# Patient Record
Sex: Female | Born: 1975 | Race: White | Hispanic: No | Smoking: Current every day smoker
Health system: Southern US, Community
[De-identification: ages and names within clinical notes are randomized; demographics above are authoritative.]

## PROBLEM LIST (undated history)

## (undated) DIAGNOSIS — R42 Dizziness and giddiness: Secondary | ICD-10-CM

## (undated) DIAGNOSIS — F32A Depression, unspecified: Secondary | ICD-10-CM

## (undated) DIAGNOSIS — F419 Anxiety disorder, unspecified: Secondary | ICD-10-CM

## (undated) DIAGNOSIS — F329 Major depressive disorder, single episode, unspecified: Secondary | ICD-10-CM

## (undated) HISTORY — DX: Depression, unspecified: F32.A

## (undated) HISTORY — DX: Anxiety disorder, unspecified: F41.9

## (undated) HISTORY — DX: Major depressive disorder, single episode, unspecified: F32.9

## (undated) HISTORY — PX: OTHER SURGICAL HISTORY: SHX169

## (undated) HISTORY — PX: SPINE SURGERY: SHX786

## (undated) HISTORY — DX: Dizziness and giddiness: R42

---

## 1999-08-05 ENCOUNTER — Other Ambulatory Visit: Admission: RE | Admit: 1999-08-05 | Discharge: 1999-08-05 | Payer: Self-pay | Admitting: Obstetrics and Gynecology

## 2000-03-08 ENCOUNTER — Inpatient Hospital Stay (HOSPITAL_COMMUNITY): Admission: AD | Admit: 2000-03-08 | Discharge: 2000-03-08 | Payer: Self-pay | Admitting: Obstetrics and Gynecology

## 2000-03-18 ENCOUNTER — Inpatient Hospital Stay (HOSPITAL_COMMUNITY): Admission: AD | Admit: 2000-03-18 | Discharge: 2000-03-20 | Payer: Self-pay | Admitting: Obstetrics & Gynecology

## 2000-09-13 ENCOUNTER — Other Ambulatory Visit: Admission: RE | Admit: 2000-09-13 | Discharge: 2000-09-13 | Payer: Self-pay | Admitting: Obstetrics and Gynecology

## 2001-09-11 ENCOUNTER — Other Ambulatory Visit: Admission: RE | Admit: 2001-09-11 | Discharge: 2001-09-11 | Payer: Self-pay | Admitting: Obstetrics and Gynecology

## 2004-11-30 ENCOUNTER — Other Ambulatory Visit: Admission: RE | Admit: 2004-11-30 | Discharge: 2004-11-30 | Payer: Self-pay | Admitting: Family Medicine

## 2005-11-08 ENCOUNTER — Encounter: Admission: RE | Admit: 2005-11-08 | Discharge: 2005-11-08 | Payer: Self-pay | Admitting: Family Medicine

## 2007-05-18 ENCOUNTER — Other Ambulatory Visit: Admission: RE | Admit: 2007-05-18 | Discharge: 2007-05-18 | Payer: Self-pay | Admitting: Obstetrics and Gynecology

## 2008-12-03 ENCOUNTER — Inpatient Hospital Stay (HOSPITAL_COMMUNITY): Admission: AD | Admit: 2008-12-03 | Discharge: 2008-12-05 | Payer: Self-pay | Admitting: Obstetrics and Gynecology

## 2009-05-24 ENCOUNTER — Emergency Department: Payer: Self-pay | Admitting: Internal Medicine

## 2010-06-29 ENCOUNTER — Emergency Department (HOSPITAL_COMMUNITY)
Admission: EM | Admit: 2010-06-29 | Discharge: 2010-06-29 | Disposition: A | Discharge status: Home / Self Care | Payer: Self-pay | Attending: Emergency Medicine | Admitting: Emergency Medicine

## 2010-06-29 DIAGNOSIS — M5430 Sciatica, unspecified side: Secondary | ICD-10-CM | POA: Insufficient documentation

## 2010-06-29 DIAGNOSIS — M545 Low back pain, unspecified: Secondary | ICD-10-CM | POA: Insufficient documentation

## 2010-08-07 LAB — CBC
Hemoglobin: 12.3 g/dL (ref 12.0–15.0)
MCHC: 34.2 g/dL (ref 30.0–36.0)
MCV: 90.6 fL (ref 78.0–100.0)
Platelets: 381 10*3/uL (ref 150–400)
Platelets: 451 10*3/uL — ABNORMAL HIGH (ref 150–400)
WBC: 18.9 10*3/uL — ABNORMAL HIGH (ref 4.0–10.5)

## 2010-09-16 ENCOUNTER — Emergency Department (HOSPITAL_COMMUNITY)
Admission: EM | Admit: 2010-09-16 | Discharge: 2010-09-16 | Disposition: A | Discharge status: Home / Self Care | Payer: Self-pay | Attending: Emergency Medicine | Admitting: Emergency Medicine

## 2010-09-16 DIAGNOSIS — M549 Dorsalgia, unspecified: Secondary | ICD-10-CM | POA: Insufficient documentation

## 2010-09-16 DIAGNOSIS — R350 Frequency of micturition: Secondary | ICD-10-CM | POA: Insufficient documentation

## 2010-09-16 LAB — POCT PREGNANCY, URINE: Preg Test, Ur: NEGATIVE

## 2010-09-16 LAB — URINALYSIS, ROUTINE W REFLEX MICROSCOPIC
Bilirubin Urine: NEGATIVE
Glucose, UA: NEGATIVE mg/dL
Ketones, ur: NEGATIVE mg/dL
Protein, ur: NEGATIVE mg/dL
Specific Gravity, Urine: 1.008 (ref 1.005–1.030)

## 2010-09-21 ENCOUNTER — Ambulatory Visit (INDEPENDENT_AMBULATORY_CARE_PROVIDER_SITE_OTHER): Payer: Self-pay | Admitting: Family Medicine

## 2010-09-21 ENCOUNTER — Encounter: Payer: Self-pay | Admitting: Family Medicine

## 2010-09-21 VITALS — BP 112/70 | HR 62 | Wt 135.0 lb

## 2010-09-21 DIAGNOSIS — M461 Sacroiliitis, not elsewhere classified: Secondary | ICD-10-CM

## 2010-09-21 NOTE — Progress Notes (Signed)
  Subjective:    Patient ID: Alicia Moss, female    DOB: 1975-11-26, 35 y.o.   MRN: [id number]  HPI she is here for evaluation of back pain. She has been seen twice in the emergency room. Once in March and again several days ago. The emergency room record was reviewed. She was given steroids as well as a pain medication. She states that she is approximately 80% better but as the day progresses she gets more pain. She points to the right low back area. She's had no numbness, tingling or weakness. No history of injury to that area. She does not work and stays home to take care of her children.    Review of Systems     Objective:   Physical Exam alert and in no distress. Tender to palpation over the right SI joint. Pearlean Brownie and [place] test is positive. Negative straight leg raising. Normal reflexes and motor.       Assessment & Plan:  Sacroiliitis Recommend heat, stretching and anti-inflammatory. If continued difficulty, she will call for referral to physical therapy. Also discussed chiropractic for her.

## 2010-09-21 NOTE — Patient Instructions (Addendum)
Heat for 20 minutes 3 times a day. Aleve, 2 pills 3 times per day. Do some back stretching When this calms down make sure you do proper sitting standing and lifting If no relief call for referral to physical therapy or a chiropractor

## 2011-03-01 ENCOUNTER — Inpatient Hospital Stay (INDEPENDENT_AMBULATORY_CARE_PROVIDER_SITE_OTHER)
Admission: RE | Admit: 2011-03-01 | Discharge: 2011-03-01 | Disposition: A | Discharge status: Home / Self Care | Payer: Self-pay | Source: Ambulatory Visit | Attending: Family Medicine | Admitting: Family Medicine

## 2011-03-01 DIAGNOSIS — M5430 Sciatica, unspecified side: Secondary | ICD-10-CM

## 2011-03-04 ENCOUNTER — Other Ambulatory Visit (HOSPITAL_COMMUNITY): Payer: Self-pay | Admitting: Orthopaedic Surgery

## 2011-03-04 DIAGNOSIS — M545 Low back pain: Secondary | ICD-10-CM

## 2011-03-04 DIAGNOSIS — M79604 Pain in right leg: Secondary | ICD-10-CM

## 2011-03-11 ENCOUNTER — Ambulatory Visit (HOSPITAL_COMMUNITY)
Admission: RE | Admit: 2011-03-11 | Discharge: 2011-03-11 | Disposition: A | Discharge status: Home / Self Care | Payer: Self-pay | Source: Ambulatory Visit | Attending: Orthopaedic Surgery | Admitting: Orthopaedic Surgery

## 2011-03-11 DIAGNOSIS — M545 Low back pain, unspecified: Secondary | ICD-10-CM | POA: Insufficient documentation

## 2011-03-11 DIAGNOSIS — M79609 Pain in unspecified limb: Secondary | ICD-10-CM | POA: Insufficient documentation

## 2011-03-11 DIAGNOSIS — M5126 Other intervertebral disc displacement, lumbar region: Secondary | ICD-10-CM | POA: Insufficient documentation

## 2011-03-11 DIAGNOSIS — M4120 Other idiopathic scoliosis, site unspecified: Secondary | ICD-10-CM | POA: Insufficient documentation

## 2011-03-11 DIAGNOSIS — M79604 Pain in right leg: Secondary | ICD-10-CM

## 2013-07-29 ENCOUNTER — Ambulatory Visit: Payer: 59 | Admitting: Physical Therapy

## 2013-08-06 ENCOUNTER — Ambulatory Visit: Payer: 59 | Attending: Orthopaedic Surgery | Admitting: Physical Therapy

## 2013-08-06 DIAGNOSIS — M545 Low back pain, unspecified: Secondary | ICD-10-CM | POA: Insufficient documentation

## 2013-08-06 DIAGNOSIS — IMO0001 Reserved for inherently not codable concepts without codable children: Secondary | ICD-10-CM | POA: Insufficient documentation

## 2013-08-06 DIAGNOSIS — M25569 Pain in unspecified knee: Secondary | ICD-10-CM | POA: Insufficient documentation

## 2013-08-13 ENCOUNTER — Ambulatory Visit: Payer: 59 | Admitting: Physical Therapy

## 2013-08-16 ENCOUNTER — Ambulatory Visit: Payer: 59 | Admitting: Physical Therapy

## 2013-08-20 ENCOUNTER — Ambulatory Visit: Payer: 59 | Admitting: Physical Therapy

## 2013-08-27 ENCOUNTER — Ambulatory Visit: Payer: 59 | Admitting: Physical Therapy

## 2013-09-03 ENCOUNTER — Ambulatory Visit: Payer: 59 | Attending: Orthopaedic Surgery | Admitting: Physical Therapy

## 2013-09-03 DIAGNOSIS — M25569 Pain in unspecified knee: Secondary | ICD-10-CM | POA: Insufficient documentation

## 2013-09-03 DIAGNOSIS — M545 Low back pain, unspecified: Secondary | ICD-10-CM | POA: Insufficient documentation

## 2013-09-03 DIAGNOSIS — IMO0001 Reserved for inherently not codable concepts without codable children: Secondary | ICD-10-CM | POA: Insufficient documentation

## 2013-09-10 ENCOUNTER — Ambulatory Visit: Payer: 59 | Admitting: Physical Therapy

## 2014-10-13 ENCOUNTER — Encounter (HOSPITAL_COMMUNITY): Payer: Self-pay

## 2014-10-13 ENCOUNTER — Emergency Department (HOSPITAL_COMMUNITY): Payer: 59

## 2014-10-13 ENCOUNTER — Emergency Department (HOSPITAL_COMMUNITY)
Admission: EM | Admit: 2014-10-13 | Discharge: 2014-10-14 | Disposition: A | Discharge status: Home / Self Care | Payer: 59 | Attending: Emergency Medicine | Admitting: Emergency Medicine

## 2014-10-13 DIAGNOSIS — R109 Unspecified abdominal pain: Secondary | ICD-10-CM | POA: Diagnosis present

## 2014-10-13 DIAGNOSIS — Z720 Tobacco use: Secondary | ICD-10-CM | POA: Insufficient documentation

## 2014-10-13 DIAGNOSIS — R52 Pain, unspecified: Secondary | ICD-10-CM

## 2014-10-13 DIAGNOSIS — Z3202 Encounter for pregnancy test, result negative: Secondary | ICD-10-CM | POA: Insufficient documentation

## 2014-10-13 DIAGNOSIS — R1011 Right upper quadrant pain: Secondary | ICD-10-CM | POA: Diagnosis not present

## 2014-10-13 LAB — D-DIMER, QUANTITATIVE: D-Dimer, Quant: 0.81 ug/mL-FEU — ABNORMAL HIGH (ref 0.00–0.48)

## 2014-10-13 LAB — URINALYSIS, ROUTINE W REFLEX MICROSCOPIC
BILIRUBIN URINE: NEGATIVE
Glucose, UA: NEGATIVE mg/dL
Ketones, ur: NEGATIVE mg/dL
Leukocytes, UA: NEGATIVE
NITRITE: NEGATIVE
Protein, ur: NEGATIVE mg/dL
SPECIFIC GRAVITY, URINE: 1.016 (ref 1.005–1.030)
UROBILINOGEN UA: 1 mg/dL (ref 0.0–1.0)
pH: 7 (ref 5.0–8.0)

## 2014-10-13 LAB — CBC WITH DIFFERENTIAL/PLATELET
Basophils Absolute: 0 10*3/uL (ref 0.0–0.1)
Basophils Relative: 0 % (ref 0–1)
EOS PCT: 2 % (ref 0–5)
Eosinophils Absolute: 0.3 10*3/uL (ref 0.0–0.7)
HEMATOCRIT: 42.9 % (ref 36.0–46.0)
HEMOGLOBIN: 14.6 g/dL (ref 12.0–15.0)
LYMPHS PCT: 26 % (ref 12–46)
Lymphs Abs: 3 10*3/uL (ref 0.7–4.0)
MCH: 30.8 pg (ref 26.0–34.0)
MCHC: 34 g/dL (ref 30.0–36.0)
MCV: 90.5 fL (ref 78.0–100.0)
MONO ABS: 0.8 10*3/uL (ref 0.1–1.0)
MONOS PCT: 7 % (ref 3–12)
NEUTROS ABS: 7.4 10*3/uL (ref 1.7–7.7)
Neutrophils Relative %: 65 % (ref 43–77)
Platelets: 271 10*3/uL (ref 150–400)
RBC: 4.74 MIL/uL (ref 3.87–5.11)
RDW: 13 % (ref 11.5–15.5)
WBC: 11.4 10*3/uL — ABNORMAL HIGH (ref 4.0–10.5)

## 2014-10-13 LAB — COMPREHENSIVE METABOLIC PANEL
ALK PHOS: 49 U/L (ref 38–126)
ALT: 17 U/L (ref 14–54)
AST: 18 U/L (ref 15–41)
Albumin: 4.1 g/dL (ref 3.5–5.0)
Anion gap: 6 (ref 5–15)
BUN: 10 mg/dL (ref 6–20)
CO2: 23 mmol/L (ref 22–32)
Calcium: 8.9 mg/dL (ref 8.9–10.3)
Chloride: 107 mmol/L (ref 101–111)
Creatinine, Ser: 0.76 mg/dL (ref 0.44–1.00)
GFR calc Af Amer: >60 mL/min (ref >60)
GLUCOSE: 96 mg/dL (ref 65–99)
POTASSIUM: 3.4 mmol/L — AB (ref 3.5–5.1)
SODIUM: 136 mmol/L (ref 135–145)
TOTAL PROTEIN: 6.9 g/dL (ref 6.5–8.1)
Total Bilirubin: 0.4 mg/dL (ref 0.3–1.2)

## 2014-10-13 LAB — URINE MICROSCOPIC-ADD ON

## 2014-10-13 LAB — LIPASE, BLOOD: LIPASE: 18 U/L — AB (ref 22–51)

## 2014-10-13 LAB — PREGNANCY, URINE: Preg Test, Ur: NEGATIVE

## 2014-10-13 MED ORDER — HYDROCODONE-ACETAMINOPHEN 5-325 MG PO TABS
2.0000 | ORAL_TABLET | Freq: Once | ORAL | Status: AC
  Administered 2014-10-13: 2 via ORAL
  Filled 2014-10-13: qty 2

## 2014-10-13 MED ORDER — ONDANSETRON 4 MG PO TBDP
4.0000 mg | ORAL_TABLET | Freq: Once | ORAL | Status: AC
  Administered 2014-10-13: 4 mg via ORAL
  Filled 2014-10-13: qty 1

## 2014-10-13 MED ORDER — HYDROMORPHONE HCL 1 MG/ML IJ SOLN
1.0000 mg | Freq: Once | INTRAMUSCULAR | Status: DC
  Filled 2014-10-13: qty 1

## 2014-10-13 MED ORDER — ONDANSETRON HCL 4 MG/2ML IJ SOLN
4.0000 mg | Freq: Once | INTRAMUSCULAR | Status: DC
  Filled 2014-10-13: qty 2

## 2014-10-13 NOTE — ED Notes (Signed)
Pt complains of side pain under the right breast that acutely happened while she's riding down the road, she states it hurts when she takes a deep breath, no injury

## 2014-10-13 NOTE — ED Provider Notes (Signed)
CSN: [id number]     Arrival date & time 10/13/14  2001 History   First MD Initiated Contact with Patient 10/13/14 2100     Chief Complaint  Patient presents with  . Flank Pain     (Consider location/radiation/quality/duration/timing/severity/associated sxs/prior Treatment) Patient is a 39 y.o. female presenting with abdominal pain. The history is provided by the patient. No language interpreter was used.  Abdominal Pain Pain location:  RUQ Pain quality: sharp and stabbing   Pain radiates to:  Does not radiate Pain severity:  Severe Onset quality:  Sudden Duration:  6 hours Timing:  Constant Progression:  Worsening Chronicity:  New Relieved by:  Nothing Worsened by:  Nothing tried Ineffective treatments:  None tried Associated symptoms: no fever and no vomiting   Risk factors: has not had multiple surgeries and not pregnant     History reviewed. No pertinent past medical history. History reviewed. No pertinent past surgical history. History reviewed. No pertinent family history. History  Substance Use Topics  . Smoking status: Current Every Day Smoker -- 0.50 packs/day    Types: Cigarettes  . Smokeless tobacco: Never Used  . Alcohol Use: Yes   OB History    No data available     Review of Systems  Constitutional: Negative for fever.  Gastrointestinal: Positive for abdominal pain. Negative for vomiting.  All other systems reviewed and are negative.     Allergies  Review of patient's allergies indicates no known allergies.  Home Medications   Prior to Admission medications   Medication Sig Start Date End Date Taking? Authorizing Provider  ACETAMINOPHEN PO Take by mouth.   Yes Historical Provider, MD  Multiple Vitamins-Minerals (HAIR/SKIN/NAILS PO) Take by mouth.   Yes Historical Provider, MD   BP 116/29 mmHg  Pulse 100  Temp(Src) 98.6 F (37 C) (Oral)  Resp 20  SpO2 100% Physical Exam  Constitutional: She is oriented to person, place, and time. She  appears well-developed and well-nourished.  HENT:  Head: Normocephalic.  Right Ear: External ear normal.  Left Ear: External ear normal.  Eyes: Conjunctivae and EOM are normal. Pupils are equal, round, and reactive to light.  Neck: Normal range of motion.  Cardiovascular: Normal rate and normal heart sounds.   Pulmonary/Chest: Effort normal.  Abdominal: Soft. She exhibits no distension.  Musculoskeletal: Normal range of motion.  Neurological: She is alert and oriented to person, place, and time.  Skin: Skin is warm.  Psychiatric: She has a normal mood and affect.  Nursing note and vitals reviewed.   ED Course  Procedures (including critical care time) Labs Review Labs Reviewed  CBC WITH DIFFERENTIAL/PLATELET - Abnormal; Notable for the following:    WBC 11.4 (*)    All other components within normal limits  COMPREHENSIVE METABOLIC PANEL - Abnormal; Notable for the following:    Potassium 3.4 (*)    All other components within normal limits  LIPASE, BLOOD - Abnormal; Notable for the following:    Lipase 18 (*)    All other components within normal limits  URINALYSIS, ROUTINE W REFLEX MICROSCOPIC (NOT AT [facility]) - Abnormal; Notable for the following:    APPearance CLOUDY (*)    Hgb urine dipstick MODERATE (*)    All other components within normal limits  URINE MICROSCOPIC-ADD ON - Abnormal; Notable for the following:    Squamous Epithelial / LPF MANY (*)    All other components within normal limits  PREGNANCY, URINE    Imaging Review No results found.  EKG Interpretation   Date/Time:  Monday October 13 2014 20:18:17 EDT Ventricular Rate:  86 PR Interval:  143 QRS Duration: 79 QT Interval:  357 QTC Calculation: 427 R Axis:   102 Text Interpretation:  Sinus rhythm LAE, consider biatrial enlargement  Right axis deviation Confirmed by Freida Busman  MD, ANTHONY ([id number]) on 10/13/2014  11:29:35 PM     Results for orders placed or performed during the hospital encounter of  10/13/14  CBC with Differential/Platelet  Result Value Ref Range   WBC 11.4 (H) 4.0 - 10.5 K/uL   RBC 4.74 3.87 - 5.11 MIL/uL   Hemoglobin 14.6 12.0 - 15.0 g/dL   HCT 60.4 54.0 - 98.1 %   MCV 90.5 78.0 - 100.0 fL   MCH 30.8 26.0 - 34.0 pg   MCHC 34.0 30.0 - 36.0 g/dL   RDW 19.1 47.8 - 29.5 %   Platelets 271 150 - 400 K/uL   Neutrophils Relative % 65 43 - 77 %   Neutro Abs 7.4 1.7 - 7.7 K/uL   Lymphocytes Relative 26 12 - 46 %   Lymphs Abs 3.0 0.7 - 4.0 K/uL   Monocytes Relative 7 3 - 12 %   Monocytes Absolute 0.8 0.1 - 1.0 K/uL   Eosinophils Relative 2 0 - 5 %   Eosinophils Absolute 0.3 0.0 - 0.7 K/uL   Basophils Relative 0 0 - 1 %   Basophils Absolute 0.0 0.0 - 0.1 K/uL  Comprehensive metabolic panel  Result Value Ref Range   Sodium 136 135 - 145 mmol/L   Potassium 3.4 (L) 3.5 - 5.1 mmol/L   Chloride 107 101 - 111 mmol/L   CO2 23 22 - 32 mmol/L   Glucose, Bld 96 65 - 99 mg/dL   BUN 10 6 - 20 mg/dL   Creatinine, Ser 6.21 0.44 - 1.00 mg/dL   Calcium 8.9 8.9 - 30.8 mg/dL   Total Protein 6.9 6.5 - 8.1 g/dL   Albumin 4.1 3.5 - 5.0 g/dL   AST 18 15 - 41 U/L   ALT 17 14 - 54 U/L   Alkaline Phosphatase 49 38 - 126 U/L   Total Bilirubin 0.4 0.3 - 1.2 mg/dL   GFR calc non Af Amer >60 >60 mL/min   GFR calc Af Amer >60 >60 mL/min   Anion gap 6 5 - 15  Lipase, blood  Result Value Ref Range   Lipase 18 (L) 22 - 51 U/L  Urinalysis, Routine w reflex microscopic (not at [facility])  Result Value Ref Range   Color, Urine YELLOW YELLOW   APPearance CLOUDY (A) CLEAR   Specific Gravity, Urine 1.016 1.005 - 1.030   pH 7.0 5.0 - 8.0   Glucose, UA NEGATIVE NEGATIVE mg/dL   Hgb urine dipstick MODERATE (A) NEGATIVE   Bilirubin Urine NEGATIVE NEGATIVE   Ketones, ur NEGATIVE NEGATIVE mg/dL   Protein, ur NEGATIVE NEGATIVE mg/dL   Urobilinogen, UA 1.0 0.0 - 1.0 mg/dL   Nitrite NEGATIVE NEGATIVE   Leukocytes, UA NEGATIVE NEGATIVE  Pregnancy, urine  Result Value Ref Range   Preg Test, Ur  NEGATIVE NEGATIVE  Urine microscopic-add on  Result Value Ref Range   Squamous Epithelial / LPF MANY (A) RARE   RBC / HPF 0-2 <3 RBC/hpf  D-dimer, quantitative (not at [facility])  Result Value Ref Range   D-Dimer, Quant 0.81 (H) 0.00 - 0.48 ug/mL-FEU   US Abdomen Complete  10/13/2014   CLINICAL DATA:  Right upper quadrant pain for 1 day.  Patient is not NPO.  EXAM: ULTRASOUND ABDOMEN COMPLETE  COMPARISON:  None.  FINDINGS: Gallbladder: Gallbladder is contracted, probably physiologic in a nonfasting patient. No stones identified. Gallbladder wall is not thickened. Murphy's sign is negative.  Common bile duct: Diameter: 4.6 mm, normal  Liver: No focal lesion identified. Within normal limits in parenchymal echogenicity.  IVC: No abnormality visualized.  Pancreas: Visualized portion unremarkable.  Spleen: Size and appearance within normal limits.  Right Kidney: Length: 10.8 cm. Echogenicity within normal limits. No mass or hydronephrosis visualized.  Left Kidney: Length: 11.8 cm. Echogenicity within normal limits. No mass or hydronephrosis visualized.  Abdominal aorta: No aneurysm visualized.  Other findings: None.  IMPRESSION: Contracted gallbladder, likely physiologic. No stones or wall thickening. Examination is otherwise unremarkable.   Electronically Signed   By: Burman Nieves M.D.   On: 10/13/2014 23:42    MDM  Pt has elevated ddimer.  Ct angio chest   Final diagnoses:  None       Elson Areas, PA-C 10/13/14 2357  Lorre Nick, MD 10/17/14 1227

## 2014-10-14 ENCOUNTER — Emergency Department (HOSPITAL_COMMUNITY): Payer: 59

## 2014-10-14 ENCOUNTER — Encounter (HOSPITAL_COMMUNITY): Payer: Self-pay

## 2014-10-14 MED ORDER — IOHEXOL 350 MG/ML SOLN
100.0000 mL | Freq: Once | INTRAVENOUS | Status: AC | PRN
  Administered 2014-10-14: 100 mL via INTRAVENOUS

## 2014-10-14 NOTE — ED Provider Notes (Signed)
RUQ abdominal pain, sudden onset tonight [place], labs normal D-dimer elevated CT angio pending r/o PE.  Negative - d/ch home - see PCP (needs resource list)  CT Angio negative for PE. She is comfortable on re-evaluation - wants to go home. Will provide resources for PCP.  Elpidio Anis, PA-C 10/14/14 0115  Elpidio Anis, PA-C 10/14/14 2633  Marisa Severin, MD 10/14/14 [phone]

## 2014-10-14 NOTE — ED Notes (Signed)
Patient transported to CT 

## 2014-10-14 NOTE — Discharge Instructions (Signed)
Abdominal Pain °Many things can cause abdominal pain. Usually, abdominal pain is not caused by a disease and will improve without treatment. It can often be observed and treated at home. Your health care provider will do a physical exam and possibly order blood tests and X-rays to help determine the seriousness of your pain. However, in many cases, more time must pass before a clear cause of the pain can be found. Before that point, your health care provider may not know if you need more testing or further treatment. °HOME CARE INSTRUCTIONS  °Monitor your abdominal pain for any changes. The following actions may help to alleviate any discomfort you are experiencing: °· Only take over-the-counter or prescription medicines as directed by your health care provider. °· Do not take laxatives unless directed to do so by your health care provider. °· Try a clear liquid diet (broth, tea, or water) as directed by your health care provider. Slowly move to a bland diet as tolerated. °SEEK MEDICAL CARE IF: °· You have unexplained abdominal pain. °· You have abdominal pain associated with nausea or diarrhea. °· You have pain when you urinate or have a bowel movement. °· You experience abdominal pain that wakes you in the night. °· You have abdominal pain that is worsened or improved by eating food. °· You have abdominal pain that is worsened with eating fatty foods. °· You have a fever. °SEEK IMMEDIATE MEDICAL CARE IF:  °· Your pain does not go away within 2 hours. °· You keep throwing up (vomiting). °· Your pain is felt only in portions of the abdomen, such as the right side or the left lower portion of the abdomen. °· You pass bloody or black tarry stools. °MAKE SURE YOU: °· Understand these instructions.   °· Will watch your condition.   °· Will get help right away if you are not doing well or get worse.   °Document Released: 01/26/2005 Document Revised: 04/23/2013 Document Reviewed: 12/26/2012 °ExitCare® Patient Information  ©2015 [employer]. This information is not intended to replace advice given to you by your health care provider. Make sure you discuss any questions you have with your health care provider. ° ° °Emergency Department Resource Guide °1) Find a Doctor and Pay Out of Pocket °Although you won't have to find out who is covered by your insurance plan, it is a good idea to ask around and get recommendations. You will then need to call the office and see if the doctor you have chosen will accept you as a new patient and what types of options they offer for patients who are self-pay. Some doctors offer discounts or will set up payment plans for their patients who do not have insurance, but you will need to ask so you aren't surprised when you get to your appointment. ° °2) Contact Your Local Health Department °Not all health departments have doctors that can see patients for sick visits, but many do, so it is worth a call to see if yours does. If you don't know where your local health department is, you can check in your phone book. The CDC also has a tool to help you locate your state's health department, and many state websites also have listings of all of their local health departments. ° °3) Find a Walk-in Clinic °If your illness is not likely to be very severe or complicated, you may want to try a walk in clinic. These are popping up all over the country in pharmacies, drugstores, and shopping centers. They're   usually staffed by nurse practitioners or physician assistants that have been trained to treat common illnesses and complaints. They're usually fairly quick and inexpensive. However, if you have serious medical issues or chronic medical problems, these are probably not your best option. ° °No Primary Care Doctor: °- Call [facility] Connect at  832-8000 - they can help you locate a primary care doctor that  accepts your insurance, provides certain services, etc. °- Physician Referral Service- 1-800-533-3463 ° °Chronic  Pain Problems: °Organization         Address  Phone   Notes  °[facility]  ([phone]) [phone] Patients need to be referred by their primary care doctor.  ° °Medication Assistance: °Organization         Address  Phone   Notes  °[place] [facility] [address] °[place], [place] [address] (336) 641-8030 --Must be a resident of [place] °-- Must have NO insurance coverage whatsoever (no Medicaid/ Medicare, etc.) °-- The pt. MUST have a primary care doctor that directs their care regularly and follows them in the community °  °[facility]  (866) 331-1348   °[employer]  (888) 892-1162   ° °Agencies that provide inexpensive medical care: °Organization         Address  Phone   Notes  °[facility]  (336) 832-8035   °[facility]    (336) 832-7272   °[facility] [address] °[place], [place] [address] (336) 832-4777   °[facility] [address], °[place] (336) 271-4999   °[facility]    (336) 373-0678   °[facility]    (336) 272-1050   °[facility] ° [address], [place] Phone:  (336) 832-4444, Fax:  (336) 832-4440 Hours of Operation:  9 am - 6 pm, M-F.  Also accepts Medicaid/Medicare and self-pay.  °[facility] ° [address], [place] Phone: (336) [phone], Fax: (336) [phone]. Hours of Operation:  8:30 am - 5:30 pm, M-F.  Also accepts Medicaid and self-pay.  °[facility] [place] [address], [place] Phone: (336) 878-6027   °[facility] [address], [place], [place] (336)723-1848, Ext. 123 Mondays & Thursdays: 7-9 AM.  First 15 patients are seen on a first come, first serve basis. °  ° °Medicaid-accepting [place] Providers: ° °Organization         Address  Phone   Notes  °[facility] [address], [place] (336) 641-2100 Also  accepts self-pay patients.  °[facility] [address], [place] ° (336) 856-9996   °[facility] [address], [place] (336) 288-8857   °[facility] [address] [place] [address], [place] (336) 299-7000   °[facility] [address], [place]  ° (336) 373-1557 Only accepts [place] Access Medicaid patients after they have their name applied to their card.  ° °Self-Pay (no insurance) in [place]: ° °Organization         Address  Phone   Notes  °Sickle Cell Patients, [facility] [address], [place] (336) 832-1970   °[facility] [address], [place] (336) 832-4400   °[facility] ° [address], [place] (336) 992-4800   °[facility]/Dr. Osei-Bonsu ° [address] [place] [address], [place] or [address], [place] (336) [phone] Phone number for both [place] and [place] locations is the same.  °[facility] [address], [place] (336) 299-0000   °[facility] [address] [place] [address], [place] or [address] (336) 852-7530 °(336) 878-2260   °[facility] [address], [place] (336) 350-1642, phone; (336) 294-5005, fax Sees patients 1st and 3rd Saturday of every month.  Must not qualify for public or private insurance (i.e. Medicaid, Medicare, [place] Health Choice, Veterans' Benefits) • Household income should be no more than 200% of the poverty level •The clinic cannot treat you if you are pregnant or think you are pregnant • Sexually transmitted diseases are not treated at the clinic.  ° ° °Dental Care: °Organization         Address  Phone  Notes  °[facility] [facility] [address], [place] (336) 641-6152 Accepts children up to age 21 who are enrolled in Medicaid or [place] Health Choice; pregnant  women with a Medicaid card; and children who have applied for Medicaid or [place] Health Choice, but were declined, whose parents can pay a reduced fee at time of service.  °[facility] [place]  [address], [place] (336) 641-7733 Accepts children up to age 21 who are enrolled in Medicaid or [place] Health Choice; pregnant women with a Medicaid card; and children who have applied for Medicaid or [place] Health Choice, but were declined, whose parents can pay a reduced fee at time of service.  °[facility] ° [address], [place] (336) 641-4533 Patients are seen by appointment only. Walk-ins are not accepted. [facility] will see patients 18 years of age and older. °Monday - Tuesday (8am-5pm) °Most Wednesdays (8:30-5pm) °$30 per visit, cash only  °[facility] ° [address], [place] (336) 641-4533 Patients are seen by appointment only. Walk-ins are not accepted. [facility] will see patients 18 years of age and older. °One Wednesday Evening (Monthly: Volunteer Based).  $30 per visit, cash only  °[facility]  (919) 537-3737 for adults; Children under age 4, call Graduate Pediatric Dentistry at (919) [phone]. Children aged 4-14, please call (919) [phone] to request a pediatric application. ° Dental services are provided in all areas of dental care including fillings, crowns and bridges, complete and partial dentures, implants, gum treatment, root canals, and extractions. Preventive care is also provided. Treatment is provided to both adults and children. °Patients are selected via a lottery and there is often a waiting list. °  °[facility] [address], °[place] ° (336) 763-8833 [email or web address] °  °[facility] [address], [place], [place] (336)723-1848, Ext. 123 Second and Fourth Thursday of each month, opens at 6:30 AM; Clinic ends at 9 AM.  Patients are  seen on a first-come first-served basis, and a limited number are seen during each clinic.  ° °[facility] ° [address], [place], [place] (336) 723-7904   Eligibility Requirements °You must have lived in [place], [place], or [place] counties for at least the last three months. °  You cannot be eligible for state or federal sponsored healthcare insurance, including Veterans Administration, Medicaid, or Medicare. °  You generally cannot be eligible for healthcare insurance through   your employer.  °  How to apply: °Eligibility screenings are held every Tuesday and Wednesday afternoon from 1:00 pm until 4:00 pm. You do not need an appointment for the interview!  °[facility] [address], [place], [place] 336-631-2330   °[facility] [facility]  336-342-8273   °[facility]  336-703-3100   °[facility]  336-570-6415   ° °Behavioral Health Resources in the Community: °Intensive Outpatient Programs °Organization         Address  Phone  Notes  °[facility] [address], [place], [place] 336-878-6098   °[facility] [address], [place], [place] 336-832-9800   °[facility] [address], [place], [place] ° 336-882-2125   °[facility] [address],  °[place], [place] 1-800-853-5163 or 336-641-4981   °Substance Abuse Resources °Organization         Address  Phone  Notes  °[facility]  336-882-2125   °[facility]  336-784-9470   °[facility]  336-285-9073   °[facility]  336-845-3988   °Residential & Outpatient Substance Abuse Program  1-800-659-3381   °Psychological Services °Organization         Address  Phone  Notes  °[facility]  336- 832-9600   °[facility]  336- 378-7881   °[facility] [address], [place] 1-800-853-5163 or 336-641-4981   ° °Mobile Crisis  Teams °Organization         Address  Phone  Notes  °[facility], Mobile Crisis Care Unit  1-877-626-1772   °Assertive °[facility] ° [address] [place], [place] 336-834-9664   °Sharon DeEsch [address] °[place] 336-554-5454   ° °Self-Help/Support Groups °Organization         Address  Phone             Notes  °[facility] - variety of support groups  336- 373-1402 Call for more information  °Narcotics Anonymous (NA), [facility] [address], °[place]  2 meetings at this location  ° °Residential Treatment Programs °Organization         Address  Phone  Notes  °[facility] [address],    °[place]  1-866-801-8205   °[facility] ° [address], [place], [place] 704-293-8524   °[facility] [address], [place] 336-845-3988 Admissions: 8am-3pm M-F  °[facility] [address]    °[place], [place] 336-841-1104   °[facility] [address], [place], [place] 336-379-7146   °[facility] [address]  °, [place] 336-285-9073   °[facility] Intensive Outpatient [address], , [place] 336-852-3033   °[facility] [address]  °[place], [place] 1-877-615-2722 or 336-784-9470   °[facility] [address] [place], [place] 336-227-7417 Accepts Medicaid  °[facility] [address]  ° [place] 1-800-659-3381 Substance Abuse/Addiction Treatment  ° °[place] Behavioral Health Resources °Organization         Address  Phone  Notes  °[facility]  (888) 581-9988   °Julie Brannon, PhD [address] [place],    (336) 349-5553 or (336) 951-0000   °[facility]   [address] °[place],  (336) 349-4454   °  [facility] [address], [place], [place] (336) 342-8316  Insurance/Medicaid/sponsorship through [facility]  °[facility] [address]                                    [place], [place] (336) 342-8316 Therapy/tele-psych/case  °[facility] [address]  ° [place], [place] (336) 349-2233    °Dr. Arfeen  (336) 349-4544   °[facility] [place]  [employer] [place] [facility] 1) [address], [place] °2) [address], [place] °3)  [address], [place] (336) 349-3220 °(336) 342-7768 ° °(336) 342-8140   °[place] Child Abuse Hotline (336) 342-1394 or (336) 342-3537 (After Hours)    ° ° ° ° °

## 2015-07-16 IMAGING — US US ABDOMEN COMPLETE
1 series · 14 of 25 positions shown · non-contrast
Comparison: None.

CLINICAL DATA: Right upper quadrant pain for 1 day. Patient is not
NPO.

EXAM:
ULTRASOUND ABDOMEN COMPLETE

[Series 1: us abdomen complete · 0.20mm/px · 14 of 89 slices shown]
[im 1/89]
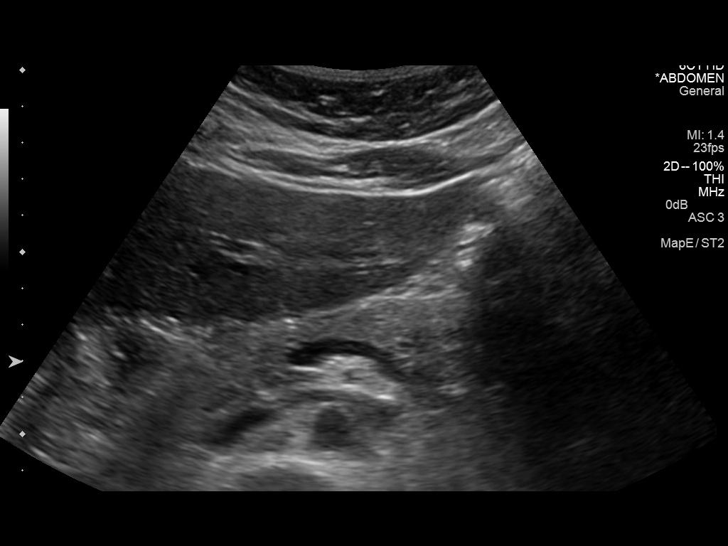
[im 8/89]
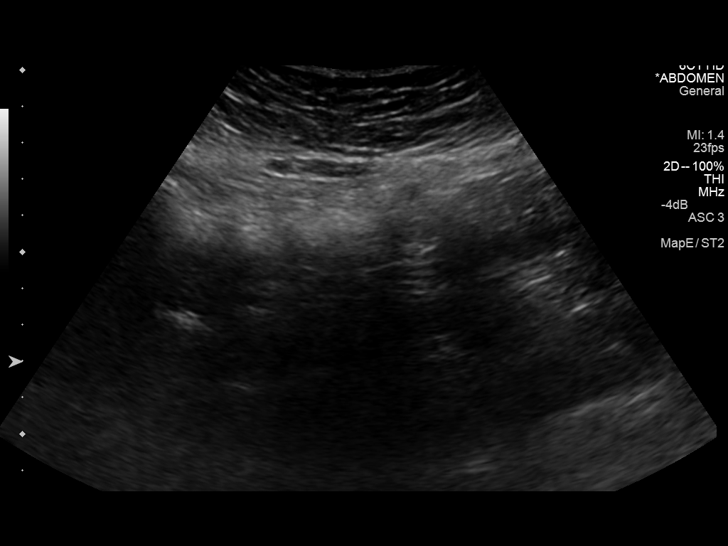
[im 15/89]
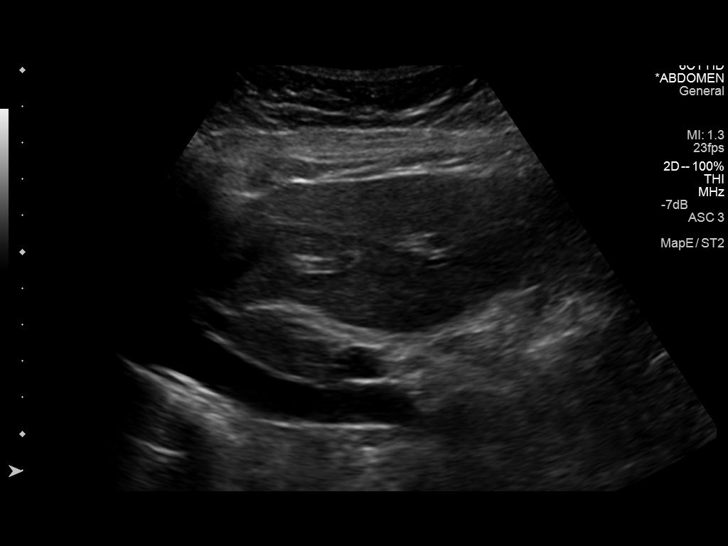
[im 23/89]
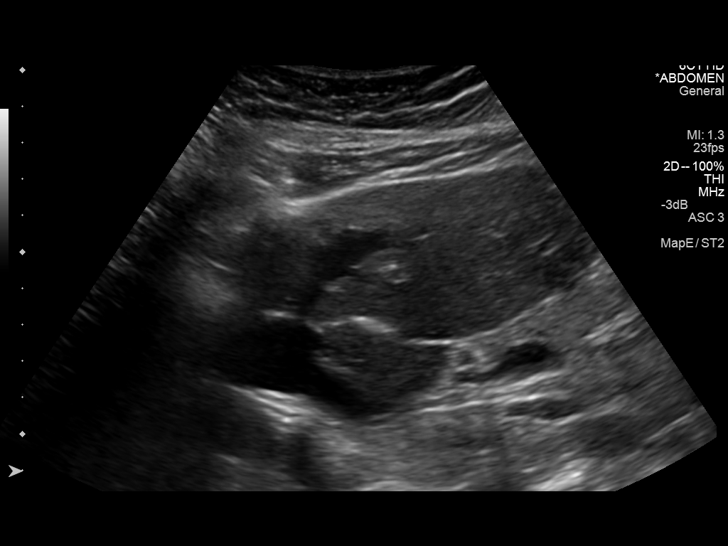
[im 30/89]
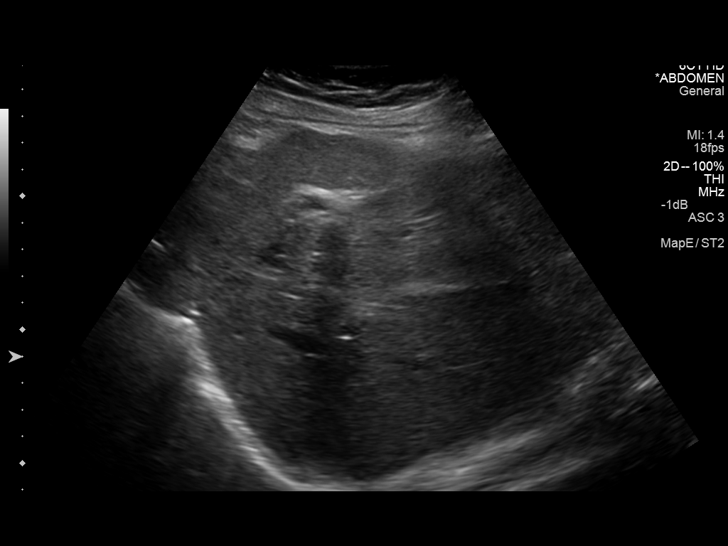
[im 34/89]
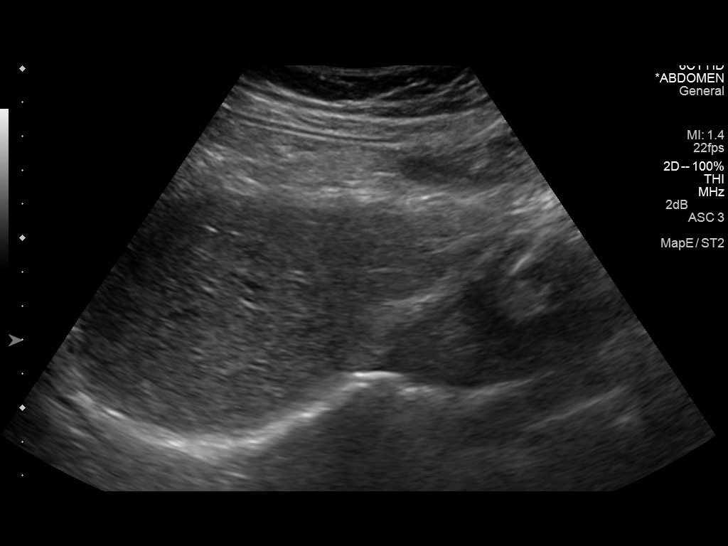
[im 41/89]
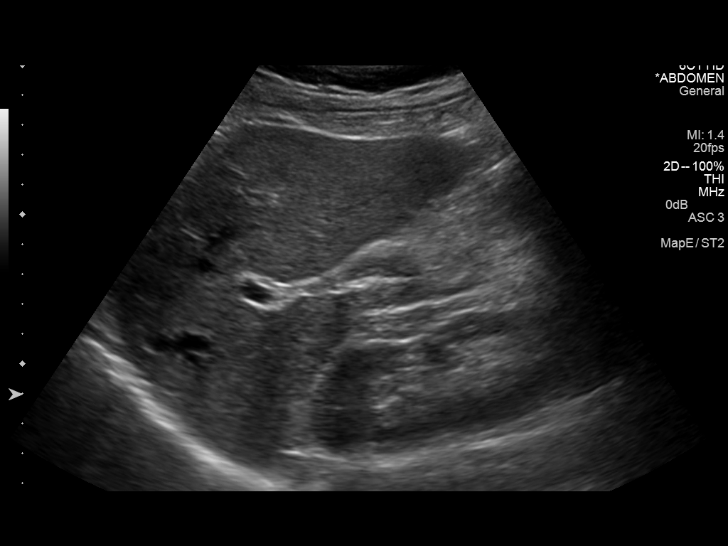
[im 48/89]
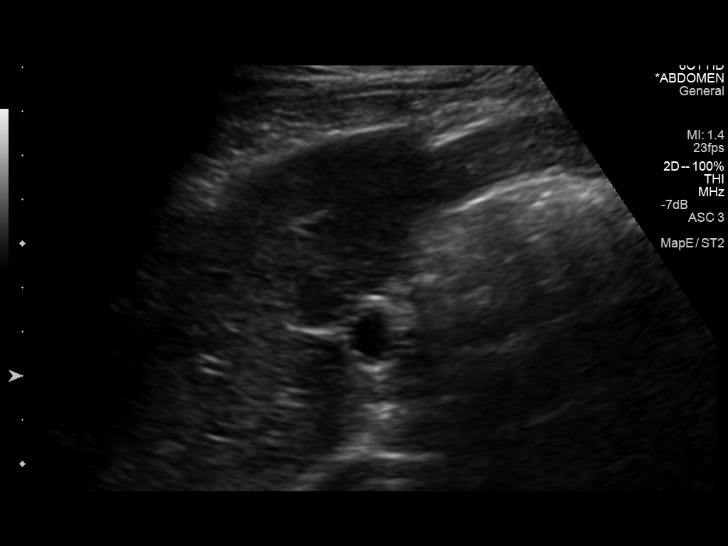
[im 56/89]
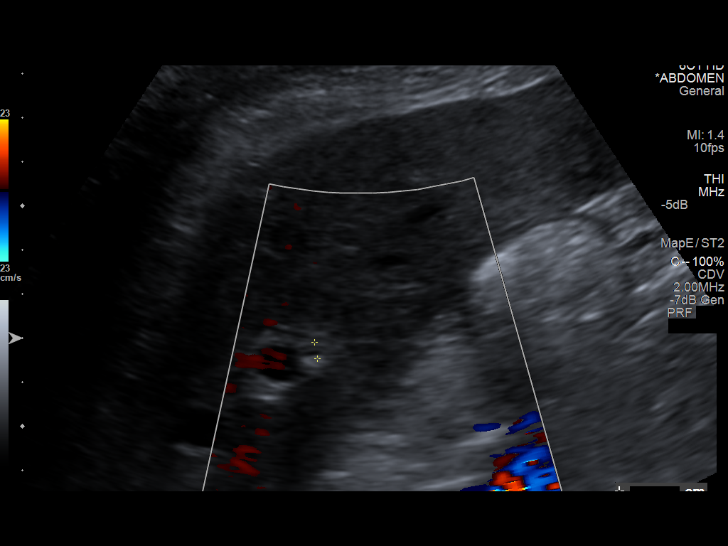
[im 59/89]
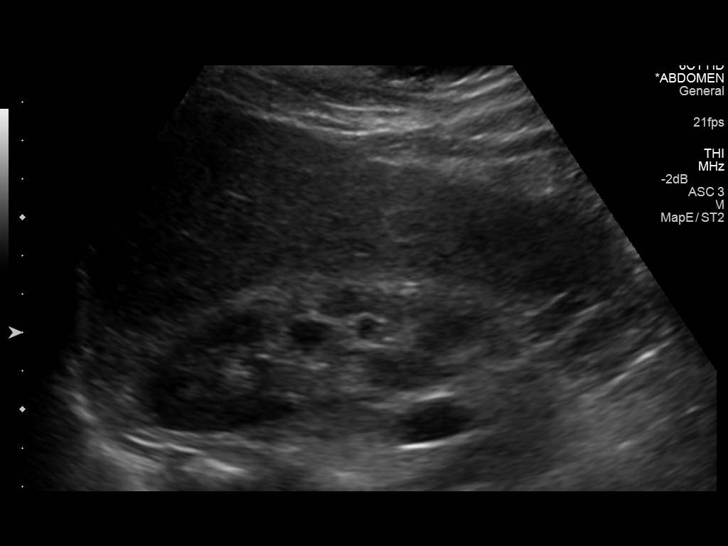
[im 67/89]
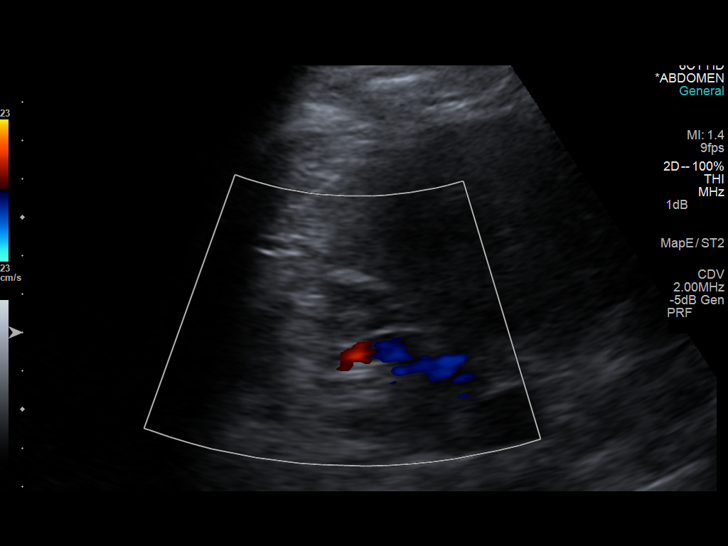
[im 74/89]
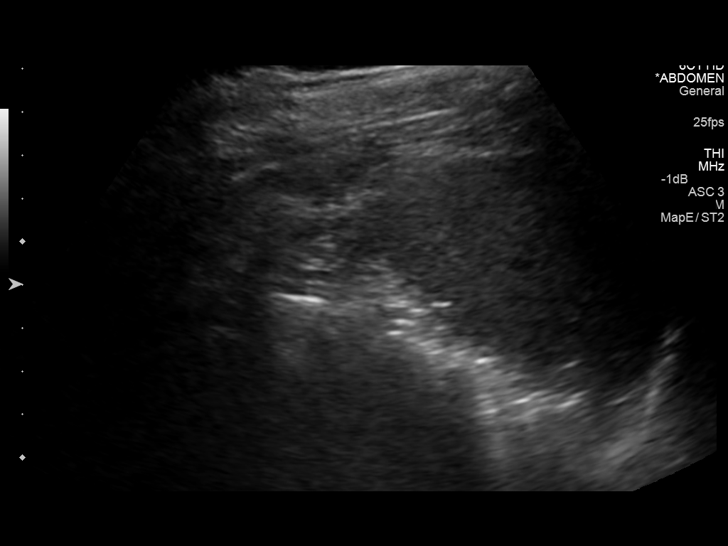
[im 81/89]
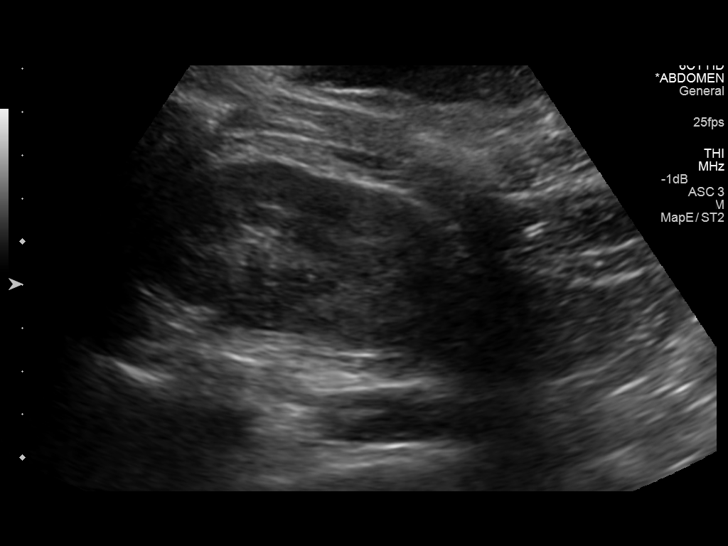
[im 89/89]
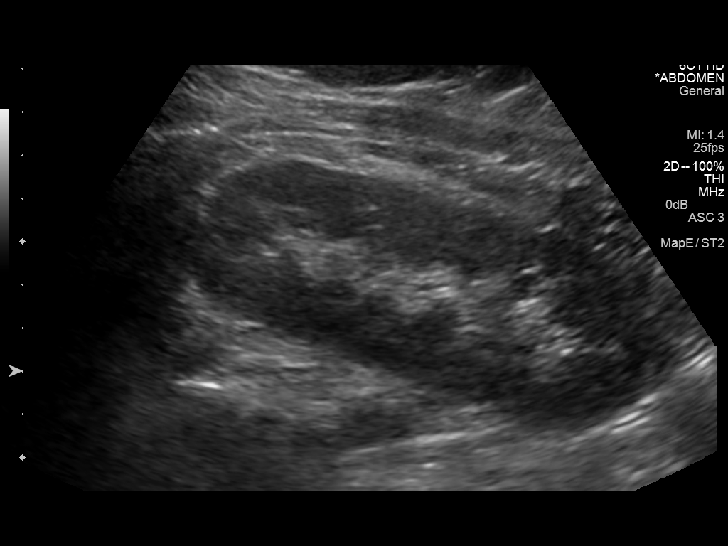

[14 of 25 positions shown; findings below may reference images not displayed]

FINDINGS: Gallbladder: Gallbladder is contracted, probably physiologic in a
nonfasting patient. No stones identified. Gallbladder wall is not
thickened. Murphy's sign is negative.

Common bile duct: Diameter: 4.6 mm, normal

Liver: No focal lesion identified. Within normal limits in
parenchymal echogenicity.

IVC: No abnormality visualized.

Pancreas: Visualized portion unremarkable.

Spleen: Size and appearance within normal limits.

Right Kidney: Length: 10.8 cm. Echogenicity within normal limits. No
mass or hydronephrosis visualized.

Left Kidney: Length: 11.8 cm. Echogenicity within normal limits. No
mass or hydronephrosis visualized.

Abdominal aorta: No aneurysm visualized.

Other findings: None.
IMPRESSION: Contracted gallbladder, likely physiologic. No stones or wall
thickening. Examination is otherwise unremarkable.

## 2015-09-25 ENCOUNTER — Ambulatory Visit (INDEPENDENT_AMBULATORY_CARE_PROVIDER_SITE_OTHER): Payer: 59 | Admitting: Family Medicine

## 2015-09-25 VITALS — BP 116/69 | HR 73 | Temp 98.0°F | Resp 16 | Ht 60.0 in | Wt 139.8 lb

## 2015-09-25 DIAGNOSIS — H6503 Acute serous otitis media, bilateral: Secondary | ICD-10-CM

## 2015-09-25 DIAGNOSIS — H8110 Benign paroxysmal vertigo, unspecified ear: Secondary | ICD-10-CM

## 2015-09-25 DIAGNOSIS — R42 Dizziness and giddiness: Secondary | ICD-10-CM

## 2015-09-25 LAB — POCT CBC
GRANULOCYTE PERCENT: 69 % (ref 37–80)
HEMATOCRIT: 46.2 % (ref 37.7–47.9)
Hemoglobin: 16.4 g/dL — AB (ref 12.2–16.2)
LYMPH, POC: 2.4 (ref 0.6–3.4)
MCH, POC: 31.3 pg — AB (ref 27–31.2)
MCHC: 35.5 g/dL — AB (ref 31.8–35.4)
MCV: 88.1 fL (ref 80–97)
MID (cbc): 0.5 (ref 0–0.9)
MPV: 7 fL (ref 0–99.8)
POC GRANULOCYTE: 6.3 (ref 2–6.9)
POC LYMPH PERCENT: 26 %L (ref 10–50)
POC MID %: 5 % (ref 0–12)
Platelet Count, POC: 230 10*3/uL (ref 142–424)
RBC: 5.24 M/uL (ref 4.04–5.48)
RDW, POC: 13.3 %
WBC: 9.2 10*3/uL (ref 4.6–10.2)

## 2015-09-25 LAB — BASIC METABOLIC PANEL
BUN: 6 mg/dL — AB (ref 7–25)
CALCIUM: 8.9 mg/dL (ref 8.6–10.2)
CHLORIDE: 104 mmol/L (ref 98–110)
CO2: 23 mmol/L (ref 20–31)
CREATININE: 0.87 mg/dL (ref 0.50–1.10)
Glucose, Bld: 82 mg/dL (ref 65–99)
Potassium: 3.6 mmol/L (ref 3.5–5.3)
Sodium: 140 mmol/L (ref 135–146)

## 2015-09-25 LAB — GLUCOSE, POCT (MANUAL RESULT ENTRY): POC GLUCOSE: 81 mg/dL (ref 70–99)

## 2015-09-25 MED ORDER — MECLIZINE HCL 25 MG PO TABS: 25.0000 mg | ORAL_TABLET | Freq: Three times a day (TID) | ORAL | Status: DC | PRN

## 2015-09-25 NOTE — Patient Instructions (Addendum)
IF you received an x-ray today, you will receive an invoice from [facility]. Please contact [facility] at [phone] with questions or concerns regarding your invoice.   IF you received labwork today, you will receive an invoice from [employer]. Please contact [facility] at [phone] with questions or concerns regarding your invoice.   Our billing staff will not be able to assist you with questions regarding bills from these companies.  You will be contacted with the lab results as soon as they are available. The fastest way to get your results is to activate your My Chart account. Instructions are located on the last page of this paperwork. If you have not heard from [place] regarding the results in 2 weeks, please contact this office.     Your dizziness is likely due to vertigo, and possibly a component of the fluid behind the ears from your previous infection. See information on these conditions below. Okay to restart the Claritin-D or other decongestant once per day as long as it is not worsening your dizziness. Start the meclizine as instructed, and see exercises below to help with the vertigo. If your symptoms are not improving in the next 2-3 weeks, or any worsening sooner, return here or the emergency room.   Benign Positional Vertigo Vertigo is the feeling that you or your surroundings are moving when they are not. Benign positional vertigo is the most common form of vertigo. The cause of this condition is not serious (is benign). This condition is triggered by certain movements and positions (is positional). This condition can be dangerous if it occurs while you are doing something that could endanger you or others, such as driving.  CAUSES In many cases, the cause of this condition is not known. It may be caused by a disturbance in an area of the inner ear that helps your brain to sense movement and balance. This disturbance can be  caused by a viral infection (labyrinthitis), head injury, or repetitive motion. RISK FACTORS This condition is more likely to develop in: 1. Women. 2. People who are 49 years of age or older. SYMPTOMS Symptoms of this condition usually happen when you move your head or your eyes in different directions. Symptoms may start suddenly, and they usually last for less than a minute. Symptoms may include:  Loss of balance and falling.  Feeling like you are spinning or moving.  Feeling like your surroundings are spinning or moving.  Nausea and vomiting.  Blurred vision.  Dizziness.  Involuntary eye movement (nystagmus). Symptoms can be mild and cause only slight annoyance, or they can be severe and interfere with daily life. Episodes of benign positional vertigo may return (recur) over time, and they may be triggered by certain movements. Symptoms may improve over time. DIAGNOSIS This condition is usually diagnosed by medical history and a physical exam of the head, neck, and ears. You may be referred to a health care provider who specializes in ear, nose, and throat (ENT) problems (otolaryngologist) or a provider who specializes in disorders of the nervous system (neurologist). You may have additional testing, including:  MRI.  A CT scan.  Eye movement tests. Your health care provider may ask you to change positions quickly while he or she watches you for symptoms of benign positional vertigo, such as nystagmus. Eye movement may be tested with an electronystagmogram (ENG), caloric stimulation, the Dix-Hallpike test, or the roll test.  An electroencephalogram (EEG). This records electrical activity in your  brain.  Hearing tests. TREATMENT Usually, your health care provider will treat this by moving your head in specific positions to adjust your inner ear back to normal. Surgery may be needed in severe cases, but this is rare. In some cases, benign positional vertigo may resolve on its own  in 2-4 weeks. HOME CARE INSTRUCTIONS Safety  Move slowly.Avoid sudden body or head movements.  Avoid driving.  Avoid operating heavy machinery.  Avoid doing any tasks that would be dangerous to you or others if a vertigo episode would occur.  If you have trouble walking or keeping your balance, try using a cane for stability. If you feel dizzy or unstable, sit down right away.  Return to your normal activities as told by your health care provider. Ask your health care provider what activities are safe for you. General Instructions  Take over-the-counter and prescription medicines only as told by your health care provider.  Avoid certain positions or movements as told by your health care provider.  Drink enough fluid to keep your urine clear or pale yellow.  Keep all follow-up visits as told by your health care provider. This is important. SEEK MEDICAL CARE IF:  You have a fever.  Your condition gets worse or you develop new symptoms.  Your family or friends notice any behavioral changes.  Your nausea or vomiting gets worse.  You have numbness or a "pins and needles" sensation. SEEK IMMEDIATE MEDICAL CARE IF:  You have difficulty speaking or moving.  You are always dizzy.  You faint.  You develop severe headaches.  You have weakness in your legs or arms.  You have changes in your hearing or vision.  You develop a stiff neck.  You develop sensitivity to light.   This information is not intended to replace advice given to you by your health care provider. Make sure you discuss any questions you have with your health care provider.   Document Released: 01/24/2006 Document Revised: 01/07/2015 Document Reviewed: 08/11/2014 Elsevier Interactive Patient Education 2016 [employer]  Serous Otitis Media Serous otitis media is fluid in the middle ear space. This space contains the bones for hearing and air. Air in the middle ear space helps to transmit sound.  The  air gets there through the eustachian tube. This tube goes from the back of the nose (nasopharynx) to the middle ear space. It keeps the pressure in the middle ear the same as the outside world. It also helps to drain fluid from the middle ear space. CAUSES  Serous otitis media occurs when the eustachian tube gets blocked. Blockage can come from: 3. Ear infections. 4. Colds and other upper respiratory infections. 5. Allergies. 6. Irritants such as cigarette smoke. 7. Sudden changes in air pressure (such as descending in an airplane). 8. Enlarged adenoids. 9. A mass in the nasopharynx. During colds and upper respiratory infections, the middle ear space can become temporarily filled with fluid. This can happen after an ear infection also. Once the infection clears, the fluid will generally drain out of the ear through the eustachian tube. If it does not, then serous otitis media occurs. SIGNS AND SYMPTOMS   Hearing loss.  A feeling of fullness in the ear, without pain.  Young children may not show any symptoms but may show slight behavioral changes, such as agitation, ear pulling, or crying. DIAGNOSIS  Serous otitis media is diagnosed by an ear exam. Tests may be done to check on the movement of the eardrum. Hearing exams may  also be done. TREATMENT  The fluid most often goes away without treatment. If allergy is the cause, allergy treatment may be helpful. Fluid that persists for several months may require minor surgery. A small tube is placed in the eardrum to:  Drain the fluid.  Restore the air in the middle ear space. In certain situations, antibiotic medicines are used to avoid surgery. Surgery may be done to remove enlarged adenoids (if this is the cause). HOME CARE INSTRUCTIONS   Keep children away from tobacco smoke.  Keep all follow-up visits as directed by your health care provider. SEEK MEDICAL CARE IF:   Your hearing is not better in 3 months.  Your hearing is  worse.  You have ear pain.  You have drainage from the ear.  You have dizziness.  You have serous otitis media only in one ear or have any bleeding from your nose (epistaxis).  You notice a lump on your neck. MAKE SURE YOU:  Understand these instructions.   Will watch your condition.   Will get help right away if you are not doing well or get worse.    This information is not intended to replace advice given to you by your health care provider. Make sure you discuss any questions you have with your health care provider.   Document Released: 07/09/2003 Document Revised: 05/09/2014 Document Reviewed: 11/13/2012   Epley Maneuver Self-Care WHAT IS THE EPLEY MANEUVER? The Epley maneuver is an exercise you can do to relieve symptoms of benign paroxysmal positional vertigo (BPPV). This condition is often just referred to as vertigo. BPPV is caused by the movement of tiny crystals (canaliths) inside your inner ear. The accumulation and movement of canaliths in your inner ear causes a sudden spinning sensation (vertigo) when you move your head to certain positions. Vertigo usually lasts about 30 seconds. BPPV usually occurs in just one ear. If you get vertigo when you lie on your left side, you probably have BPPV in your left ear. Your health care provider can tell you which ear is involved.  BPPV may be caused by a head injury. Many people older than 50 get BPPV for unknown reasons. If you have been diagnosed with BPPV, your health care provider may teach you how to do this maneuver. BPPV is not life threatening (benign) and usually goes away in time.  WHEN SHOULD I PERFORM THE EPLEY MANEUVER? You can do this maneuver at home whenever you have symptoms of vertigo. You may do the Epley maneuver up to 3 times a day until your symptoms of vertigo go away. HOW SHOULD I DO THE EPLEY MANEUVER? 10. Sit on the edge of a bed or table with your back straight. Your legs should be extended or hanging  over the edge of the bed or table.  11. Turn your head halfway toward the affected ear.  12. Lie backward quickly with your head turned until you are lying flat on your back. You may want to position a pillow under your shoulders.  13. Hold this position for 30 seconds. You may experience an attack of vertigo. This is normal. Hold this position until the vertigo stops. 14. Then turn your head to the opposite direction until your unaffected ear is facing the floor.  15. Hold this position for 30 seconds. You may experience an attack of vertigo. This is normal. Hold this position until the vertigo stops. 16. Now turn your whole body to the same side as your head. Hold for another  30 seconds.  17. You can then sit back up. ARE THERE RISKS TO THIS MANEUVER? In some cases, you may have other symptoms (such as changes in your vision, weakness, or numbness). If you have these symptoms, stop doing the maneuver and call your health care provider. Even if doing these maneuvers relieves your vertigo, you may still have dizziness. Dizziness is the sensation of light-headedness but without the sensation of movement. Even though the Epley maneuver may relieve your vertigo, it is possible that your symptoms will return within 5 years. WHAT SHOULD I DO AFTER THIS MANEUVER? After doing the Epley maneuver, you can return to your normal activities. Ask your doctor if there is anything you should do at home to prevent vertigo. This may include:  Sleeping with two or more pillows to keep your head elevated.  Not sleeping on the side of your affected ear.  Getting up slowly from bed.  Avoiding sudden movements during the day.  Avoiding extreme head movement, like looking up or bending over.  Wearing a cervical collar to prevent sudden head movements. WHAT SHOULD I DO IF MY SYMPTOMS GET WORSE? Call your health care provider if your vertigo gets worse. Call your provider right way if you have other symptoms,  including:   Nausea.  Vomiting.  Headache.  Weakness.  Numbness.  Vision changes.   This information is not intended to replace advice given to you by your health care provider. Make sure you discuss any questions you have with your health care provider.   Document Released: 04/23/2013 Document Reviewed: 04/23/2013 Elsevier Interactive Patient Education 2016 [employer].  Risk analyst Patient Education [employer]

## 2015-09-25 NOTE — Progress Notes (Signed)
Subjective:    Patient ID: Alicia Moss, female    DOB: 12-16-1975, 40 y.o.   MRN: [id number]  HPI Alicia Moss is a 40 y.o. female  Presents with complaint of dizziness. Symptoms for past 2-3 months.  Seemed to have started after sinus infection/cold sx's. Sinus sx's improved with claritn D, but dizziness still there and getting worse.   Seen by PCP about a month ago.  Was started on Wellbutrin - family stressors. Helped with emotional sx's but dizziness improved. Brother had aortic rupture with LE paralysis since March (45 yo). Son is autistic - multiple care providers. 6yo son. Works full time - Psychiatristruns insurance office.   Notices dizziness with turning head to answer phone, or with movement or bending down. Not dizzy at rest typically.  No chest pain or palpitations recently - did notice this prior to wellbutrin with anxiety.   No hx of anemia, but has tried iron every other day, tried increase water intake, eating regular meals. No otc meds. Takes tramadol for slipped disk about twice per week. Has IUD, no recent menses (last had 1 year ago). No chance of pregnancy.     There are no active problems to display for this patient.  Past Medical History  Diagnosis Date  . Anxiety   . Depression    Past Surgical History  Procedure Laterality Date  . Slip disk Right   . Spine surgery     No Known Allergies Prior to Admission medications   Medication Sig Start Date End Date Taking? Authorizing Provider  buPROPion (WELLBUTRIN SR) 150 MG 12 hr tablet Take 150 mg by mouth 2 (two) times daily.   Yes Historical Provider, MD  ferrous sulfate 325 (65 FE) MG tablet Take 325 mg by mouth daily with breakfast.   Yes Historical Provider, MD  traMADol (ULTRAM) 50 MG tablet Take by mouth every 6 (six) hours as needed.   Yes Historical Provider, MD   Social History   Social History  . Marital Status: Divorced    Spouse Name: N/A  . Number of Children: N/A  . Years of Education: N/A     Occupational History  . Not on file.   Social History Main Topics  . Smoking status: Current Every Day Smoker -- 0.50 packs/day    Types: Cigarettes  . Smokeless tobacco: Never Used  . Alcohol Use: Yes  . Drug Use: Not on file  . Sexual Activity: Not on file   Other Topics Concern  . Not on file   Social History Narrative      Review of Systems  Constitutional: Negative for fever, chills, diaphoresis and unexpected weight change.  HENT: Negative for congestion, rhinorrhea and sinus pressure.   Respiratory: Negative for shortness of breath.   Cardiovascular: Negative for chest pain and palpitations.  Neurological: Positive for dizziness. Negative for seizures, facial asymmetry, speech difficulty, weakness, light-headedness and numbness.  Psychiatric/Behavioral: The patient is nervous/anxious.        Objective:   Physical Exam  Constitutional: She is oriented to person, place, and time. She appears well-developed and well-nourished.  HENT:  Head: Normocephalic and atraumatic.  Right Ear: A middle ear effusion (Clear fluid, minimal, bilateral TMs. Canals clear) is present.  Left Ear: A middle ear effusion is present.  Eyes: Conjunctivae are normal. Pupils are equal, round, and reactive to light. Right eye exhibits nystagmus. Left eye exhibits nystagmus (Few beats of horizontal nystagmus with sitting up from supine position. Reproduce dizziness.).  Neck: Carotid bruit is not present.  Cardiovascular: Normal rate, regular rhythm, normal heart sounds and intact distal pulses.   Pulmonary/Chest: Effort normal and breath sounds normal.  Abdominal: Soft. She exhibits no pulsatile midline mass. There is no tenderness.  Neurological: She is alert and oriented to person, place, and time. She has normal strength. She displays no tremor. No cranial nerve deficit or sensory deficit. She displays a negative Romberg sign. She displays no seizure activity. Coordination and gait normal.   Normal speech, no focal weakness  No pronator drift.    Skin: Skin is warm and dry.  Psychiatric: She has a normal mood and affect. Her behavior is normal.  Vitals reviewed.   Filed Vitals:   09/25/15 1407  BP: 116/69  Pulse: 73  Temp: 98 F (36.7 C)  TempSrc: Oral  Resp: 16  Height: 5' (1.524 m)  Weight: 139 lb 12.8 oz (63.413 kg)  SpO2: 98%     EKG: Dennis rhythm, no acute findings, nonspecific T-wave V2.  Results for orders placed or performed in visit on 09/25/15  POCT CBC  Result Value Ref Range   WBC 9.2 4.6 - 10.2 K/uL   Lymph, poc 2.4 0.6 - 3.4   POC LYMPH PERCENT 26.0 10 - 50 %L   MID (cbc) 0.5 0 - 0.9   POC MID % 5.0 0 - 12 %M   POC Granulocyte 6.3 2 - 6.9   Granulocyte percent 69.0 37 - 80 %G   RBC 5.24 4.04 - 5.48 M/uL   Hemoglobin 16.4 (A) 12.2 - 16.2 g/dL   HCT, POC 16.1 09.6 - 47.9 %   MCV 88.1 80 - 97 fL   MCH, POC 31.3 (A) 27 - 31.2 pg   MCHC 35.5 (A) 31.8 - 35.4 g/dL   RDW, POC 04.5 %   Platelet Count, POC 230 142 - 424 K/uL   MPV 7.0 0 - 99.8 fL  POCT glucose (manual entry)  Result Value Ref Range   POC Glucose 81 70 - 99 mg/dl        Assessment & Plan:   [name] is a 40 y.o. female Dizziness - Plan: EKG 12-Lead, POCT CBC, POCT glucose (manual entry), Basic metabolic panel, meclizine (ANTIVERT) 25 MG tablet  Benign paroxysmal positional vertigo, unspecified laterality  Bilateral acute serous otitis media, recurrence not specified  Small amount of serous otitis from previous sinus congestion, but primary symptoms likely due to benign positional vertigo. Reassuring, nonfocal neuro exam. Trial of over-the-counter decongestant if needed, meclizine if needed, and Epley self maneuvers discussed and handout given. If not improving the next 2-3 weeks with these treatments, or worsening sooner, return for recheck. BMP, but suspect above cause.  Meds ordered this encounter  Medications  . buPROPion (WELLBUTRIN SR) 150 MG 12 hr tablet     Sig: Take 150 mg by mouth 2 (two) times daily.  . traMADol (ULTRAM) 50 MG tablet    Sig: Take by mouth every 6 (six) hours as needed.  . ferrous sulfate 325 (65 FE) MG tablet    Sig: Take 325 mg by mouth daily with breakfast.  . meclizine (ANTIVERT) 25 MG tablet    Sig: Take 1 tablet (25 mg total) by mouth 3 (three) times daily as needed for dizziness.    Dispense:  30 tablet    Refill:  0   Patient Instructions       IF you received an x-ray today, you will receive an invoice from  [facility]. Please contact [facility] at [phone] with questions or concerns regarding your invoice.   IF you received labwork today, you will receive an invoice from [employer]. Please contact [facility] at [phone] with questions or concerns regarding your invoice.   Our billing staff will not be able to assist you with questions regarding bills from these companies.  You will be contacted with the lab results as soon as they are available. The fastest way to get your results is to activate your My Chart account. Instructions are located on the last page of this paperwork. If you have not heard from [place] regarding the results in 2 weeks, please contact this office.     Your dizziness is likely due to vertigo, and possibly a component of the fluid behind the ears from your previous infection. See information on these conditions below. Okay to restart the Claritin-D or other decongestant once per day as long as it is not worsening your dizziness. Start the meclizine as instructed, and see exercises below to help with the vertigo. If your symptoms are not improving in the next 2-3 weeks, or any worsening sooner, return here or the emergency room.   Benign Positional Vertigo Vertigo is the feeling that you or your surroundings are moving when they are not. Benign positional vertigo is the most common form of vertigo. The cause of this condition is not  serious (is benign). This condition is triggered by certain movements and positions (is positional). This condition can be dangerous if it occurs while you are doing something that could endanger you or others, such as driving.  CAUSES In many cases, the cause of this condition is not known. It may be caused by a disturbance in an area of the inner ear that helps your brain to sense movement and balance. This disturbance can be caused by a viral infection (labyrinthitis), head injury, or repetitive motion. RISK FACTORS This condition is more likely to develop in: 1. Women. 2. People who are 20 years of age or older. SYMPTOMS Symptoms of this condition usually happen when you move your head or your eyes in different directions. Symptoms may start suddenly, and they usually last for less than a minute. Symptoms may include:  Loss of balance and falling.  Feeling like you are spinning or moving.  Feeling like your surroundings are spinning or moving.  Nausea and vomiting.  Blurred vision.  Dizziness.  Involuntary eye movement (nystagmus). Symptoms can be mild and cause only slight annoyance, or they can be severe and interfere with daily life. Episodes of benign positional vertigo may return (recur) over time, and they may be triggered by certain movements. Symptoms may improve over time. DIAGNOSIS This condition is usually diagnosed by medical history and a physical exam of the head, neck, and ears. You may be referred to a health care provider who specializes in ear, nose, and throat (ENT) problems (otolaryngologist) or a provider who specializes in disorders of the nervous system (neurologist). You may have additional testing, including:  MRI.  A CT scan.  Eye movement tests. Your health care provider may ask you to change positions quickly while he or she watches you for symptoms of benign positional vertigo, such as nystagmus. Eye movement may be tested with an electronystagmogram  (ENG), caloric stimulation, the Dix-Hallpike test, or the roll test.  An electroencephalogram (EEG). This records electrical activity in your brain.  Hearing tests. TREATMENT Usually, your health care provider will treat this by  moving your head in specific positions to adjust your inner ear back to normal. Surgery may be needed in severe cases, but this is rare. In some cases, benign positional vertigo may resolve on its own in 2-4 weeks. HOME CARE INSTRUCTIONS Safety  Move slowly.Avoid sudden body or head movements.  Avoid driving.  Avoid operating heavy machinery.  Avoid doing any tasks that would be dangerous to you or others if a vertigo episode would occur.  If you have trouble walking or keeping your balance, try using a cane for stability. If you feel dizzy or unstable, sit down right away.  Return to your normal activities as told by your health care provider. Ask your health care provider what activities are safe for you. General Instructions  Take over-the-counter and prescription medicines only as told by your health care provider.  Avoid certain positions or movements as told by your health care provider.  Drink enough fluid to keep your urine clear or pale yellow.  Keep all follow-up visits as told by your health care provider. This is important. SEEK MEDICAL CARE IF:  You have a fever.  Your condition gets worse or you develop new symptoms.  Your family or friends notice any behavioral changes.  Your nausea or vomiting gets worse.  You have numbness or a "pins and needles" sensation. SEEK IMMEDIATE MEDICAL CARE IF:  You have difficulty speaking or moving.  You are always dizzy.  You faint.  You develop severe headaches.  You have weakness in your legs or arms.  You have changes in your hearing or vision.  You develop a stiff neck.  You develop sensitivity to light.   This information is not intended to replace advice given to you by your  health care provider. Make sure you discuss any questions you have with your health care provider.   Document Released: 01/24/2006 Document Revised: 01/07/2015 Document Reviewed: 08/11/2014 Elsevier Interactive Patient Education 2016 Elsevier Inc.  Serous Otitis Media Serous otitis media is fluid in the middle ear space. This space contains the bones for hearing and air. Air in the middle ear space helps to transmit sound.  The air gets there through the eustachian tube. This tube goes from the back of the nose (nasopharynx) to the middle ear space. It keeps the pressure in the middle ear the same as the outside world. It also helps to drain fluid from the middle ear space. CAUSES  Serous otitis media occurs when the eustachian tube gets blocked. Blockage can come from: 3. Ear infections. 4. Colds and other upper respiratory infections. 5. Allergies. 6. Irritants such as cigarette smoke. 7. Sudden changes in air pressure (such as descending in an airplane). 8. Enlarged adenoids. 9. A mass in the nasopharynx. During colds and upper respiratory infections, the middle ear space can become temporarily filled with fluid. This can happen after an ear infection also. Once the infection clears, the fluid will generally drain out of the ear through the eustachian tube. If it does not, then serous otitis media occurs. SIGNS AND SYMPTOMS   Hearing loss.  A feeling of fullness in the ear, without pain.  Young children may not show any symptoms but may show slight behavioral changes, such as agitation, ear pulling, or crying. DIAGNOSIS  Serous otitis media is diagnosed by an ear exam. Tests may be done to check on the movement of the eardrum. Hearing exams may also be done. TREATMENT  The fluid most often goes away without treatment. If allergy  is the cause, allergy treatment may be helpful. Fluid that persists for several months may require minor surgery. A small tube is placed in the eardrum  to:  Drain the fluid.  Restore the air in the middle ear space. In certain situations, antibiotic medicines are used to avoid surgery. Surgery may be done to remove enlarged adenoids (if this is the cause). HOME CARE INSTRUCTIONS   Keep children away from tobacco smoke.  Keep all follow-up visits as directed by your health care provider. SEEK MEDICAL CARE IF:   Your hearing is not better in 3 months.  Your hearing is worse.  You have ear pain.  You have drainage from the ear.  You have dizziness.  You have serous otitis media only in one ear or have any bleeding from your nose (epistaxis).  You notice a lump on your neck. MAKE SURE YOU:  Understand these instructions.   Will watch your condition.   Will get help right away if you are not doing well or get worse.    This information is not intended to replace advice given to you by your health care provider. Make sure you discuss any questions you have with your health care provider.   Document Released: 07/09/2003 Document Revised: 05/09/2014 Document Reviewed: 11/13/2012   Epley Maneuver Self-Care WHAT IS THE EPLEY MANEUVER? The Epley maneuver is an exercise you can do to relieve symptoms of benign paroxysmal positional vertigo (BPPV). This condition is often just referred to as vertigo. BPPV is caused by the movement of tiny crystals (canaliths) inside your inner ear. The accumulation and movement of canaliths in your inner ear causes a sudden spinning sensation (vertigo) when you move your head to certain positions. Vertigo usually lasts about 30 seconds. BPPV usually occurs in just one ear. If you get vertigo when you lie on your left side, you probably have BPPV in your left ear. Your health care provider can tell you which ear is involved.  BPPV may be caused by a head injury. Many people older than 50 get BPPV for unknown reasons. If you have been diagnosed with BPPV, your health care provider may teach you how to  do this maneuver. BPPV is not life threatening (benign) and usually goes away in time.  WHEN SHOULD I PERFORM THE EPLEY MANEUVER? You can do this maneuver at home whenever you have symptoms of vertigo. You may do the Epley maneuver up to 3 times a day until your symptoms of vertigo go away. HOW SHOULD I DO THE EPLEY MANEUVER? 10. Sit on the edge of a bed or table with your back straight. Your legs should be extended or hanging over the edge of the bed or table.  11. Turn your head halfway toward the affected ear.  12. Lie backward quickly with your head turned until you are lying flat on your back. You may want to position a pillow under your shoulders.  13. Hold this position for 30 seconds. You may experience an attack of vertigo. This is normal. Hold this position until the vertigo stops. 14. Then turn your head to the opposite direction until your unaffected ear is facing the floor.  15. Hold this position for 30 seconds. You may experience an attack of vertigo. This is normal. Hold this position until the vertigo stops. 16. Now turn your whole body to the same side as your head. Hold for another 30 seconds.  17. You can then sit back up. ARE THERE RISKS TO THIS  MANEUVER? In some cases, you may have other symptoms (such as changes in your vision, weakness, or numbness). If you have these symptoms, stop doing the maneuver and call your health care provider. Even if doing these maneuvers relieves your vertigo, you may still have dizziness. Dizziness is the sensation of light-headedness but without the sensation of movement. Even though the Epley maneuver may relieve your vertigo, it is possible that your symptoms will return within 5 years. WHAT SHOULD I DO AFTER THIS MANEUVER? After doing the Epley maneuver, you can return to your normal activities. Ask your doctor if there is anything you should do at home to prevent vertigo. This may include:  Sleeping with two or more pillows to keep your  head elevated.  Not sleeping on the side of your affected ear.  Getting up slowly from bed.  Avoiding sudden movements during the day.  Avoiding extreme head movement, like looking up or bending over.  Wearing a cervical collar to prevent sudden head movements. WHAT SHOULD I DO IF MY SYMPTOMS GET WORSE? Call your health care provider if your vertigo gets worse. Call your provider right way if you have other symptoms, including:   Nausea.  Vomiting.  Headache.  Weakness.  Numbness.  Vision changes.   This information is not intended to replace advice given to you by your health care provider. Make sure you discuss any questions you have with your health care provider.   Document Released: 04/23/2013 Document Reviewed: 04/23/2013 Elsevier Interactive Patient Education 2016 [employer].  Risk analyst Patient Education [employer]     .

## 2015-10-12 ENCOUNTER — Telehealth: Payer: Self-pay

## 2015-10-12 NOTE — Telephone Encounter (Signed)
Pt states that dr Neva Seatgreene stated if meds did not work he would refer -pt is requesting a referral she did not say what the problem was i believe it might be neurology  Best number [phone]

## 2015-10-13 NOTE — Telephone Encounter (Signed)
Left message for pt to call back  °

## 2015-10-15 ENCOUNTER — Ambulatory Visit (INDEPENDENT_AMBULATORY_CARE_PROVIDER_SITE_OTHER): Payer: 59 | Admitting: Family Medicine

## 2015-10-15 VITALS — BP 118/70 | HR 81 | Temp 98.0°F | Resp 16 | Ht 60.0 in | Wt 140.4 lb

## 2015-10-15 DIAGNOSIS — R42 Dizziness and giddiness: Secondary | ICD-10-CM | POA: Diagnosis not present

## 2015-10-15 NOTE — Telephone Encounter (Signed)
If dizziness is still with head movement, likely middle ear, and ENT would be initial evaluation. However if not associated with head movement, would recommend she be seen by neurology first.  Let me know which symptoms she is having, and I can refer her. If she does feel like she's had a change in her dizziness or significant worsening, I would recommend she be seen here or other medical provider to make sure no other testing needed in the interim. Let me know if there are other questions, and where she would like to be referred.

## 2015-10-15 NOTE — Telephone Encounter (Signed)
Spoke with pt, I advised her that Dr. Neva SeatGreene wanted her to come in to be seen according to his note if she was not better. Pt states she does not want to come in and wait for hours to be seen again. She would rather just be referred out. She feels like her dizziness is getting worse. I did advise her to come in.

## 2015-10-15 NOTE — Patient Instructions (Addendum)
  You should be getting a call from our office in the next day or so with an appointment for a specialist in dizziness.   IF you received an x-ray today, [facility] will [facility]. Please contact [facility] at [phone] with questions or concerns regarding your invoice.   IF you received labwork today, you will receive an invoice from [facility]. Please contact [facility] at [phone] with questions or concerns regarding your invoice.   Our billing staff will not be able to assist you with questions regarding bills from these companies.  You will be contacted with the lab results as soon as they are available. The fastest way to get your results is to activate your My Chart account. Instructions are located on the last page of this paperwork. If you have not heard from us regarding the results in 2 weeks, please contact this office.

## 2015-10-15 NOTE — Progress Notes (Signed)
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  At first it was more fluctuating but now it has worsened to the point constant difficulty.  Worse with positional change.  Able to sleep.    Perhaps hearing is slightly worse.  No tinnitus.  No ear pain.  Objective:  BP 118/70 mmHg  Pulse 81  Temp(Src) 98 F (36.7 C) (Oral)  Resp 16  Ht 5' (1.524 m)  Wt 140 lb 6.4 oz (63.685 kg)  BMI 27.42 kg/m2  SpO2 96% Results for orders placed or performed in visit on 09/25/15  Basic metabolic panel  Result Value Ref Range   Sodium 140 135 - 146 mmol/L   Potassium 3.6 3.5 - 5.3 mmol/L   Chloride 104 98 - 110 mmol/L   CO2 23 20 - 31 mmol/L   Glucose, Bld 82 65 - 99 mg/dL   BUN 6 (L) 7 - 25 mg/dL   Creat 5.620.87 1.300.50 - 8.651.10 mg/dL   Calcium 8.9 8.6 - 78.410.2 mg/dL  POCT CBC  Result Value Ref Range   WBC 9.2 4.6 - 10.2 K/uL   Lymph, poc 2.4 0.6 - 3.4   POC LYMPH PERCENT 26.0 10 - 50 %L   MID (cbc) 0.5 0 - 0.9   POC MID % 5.0 0 - 12 %M   POC Granulocyte 6.3 2 - 6.9   Granulocyte percent 69.0 37 - 80 %G   RBC 5.24 4.04 - 5.48 M/uL   Hemoglobin 16.4 (A) 12.2 - 16.2 g/dL   HCT, POC 69.646.2 29.537.7 - 47.9 %   MCV 88.1 80 - 97 fL   MCH, POC 31.3 (A) 27 - 31.2 pg   MCHC 35.5 (A) 31.8 - 35.4 g/dL   RDW, POC 28.413.3 %   Platelet Count, POC 230 142 - 424 K/uL   MPV 7.0 0 - 99.8 fL  POCT glucose (manual entry)  Result Value Ref Range   POC Glucose 81 70 - 99 mg/dl    Ears:  Normal appearance of TM's Oroph:  Clear CN 2-12: intact Able to tandem gait without falling.  Assessment:  Persistent and worsening dizziness  Plan:  ENT consultation.  Mosie EpsteinK Breleigh Carpino, MD

## 2015-10-16 NOTE — Telephone Encounter (Signed)
Left message for pt to call back  °

## 2015-11-05 ENCOUNTER — Other Ambulatory Visit: Payer: Self-pay | Admitting: Otolaryngology

## 2015-11-05 DIAGNOSIS — R42 Dizziness and giddiness: Secondary | ICD-10-CM

## 2015-11-13 ENCOUNTER — Ambulatory Visit
Admission: RE | Admit: 2015-11-13 | Discharge: 2015-11-13 | Disposition: A | Discharge status: Home / Self Care | Payer: 59 | Source: Ambulatory Visit | Attending: Otolaryngology | Admitting: Otolaryngology

## 2015-11-13 DIAGNOSIS — R42 Dizziness and giddiness: Secondary | ICD-10-CM

## 2015-11-26 ENCOUNTER — Ambulatory Visit (INDEPENDENT_AMBULATORY_CARE_PROVIDER_SITE_OTHER): Payer: 59 | Admitting: Neurology

## 2015-11-26 ENCOUNTER — Encounter: Payer: Self-pay | Admitting: Neurology

## 2015-11-26 VITALS — BP 114/69 | HR 94 | Ht 60.0 in | Wt 141.4 lb

## 2015-11-26 DIAGNOSIS — R42 Dizziness and giddiness: Secondary | ICD-10-CM

## 2015-11-26 NOTE — Progress Notes (Signed)
[facility]    Provider:  Dr Lucia Gaskins Referring Provider: Elvina Sidle, MD Primary Care Physician:  Kaleen Mask, MD  CC:  chronic dizziness  HPI:  Alicia Moss is a 40 y.o. female here as a referral from Dr. Milus Glazier for Headaches and dizziness. Past medical history of Dizziness and headaches. Chronic dizziness since last August/November. She has tried Claritin D due to congestion problems which did not help, started with feeling "off-steady", worsening, she can;t stay focused when she is in a restaurant and there is so much movement for example. Movement makes it worse. This is in the setting of anxiety, she gets panicked. She feels overwhelmed and she has anxiety. Not associated with new medications, these symptoms preceded the Wellbutrin. She is dizzy all the time now, all day long, even right now. Worse with bending over, movement, turning but also with just sitting she is dizzy right now. Better with standing still or being still. Worse with movement around her. She has room spinning. Feels like drunk. She has headaches in the front of the head. She denies migrainous symptoms with the headache. Pressure headaches. No other focal neurologic deficits. Symptoms are significantly affecting her life and she's been written out of work for 4 weeks by her psychiatrist. No other associated symptoms. No family history of migraines or vestibular disorder.   Reviewed notes, labs and imaging from outside physicians, which showed: Reviewed notes from ENT care. Patient presented with headaches and dizziness. Dizziness for the past 3-4 months. She feels like she is drunk, feels wobbly on stairs, movement associated with driving is aggravated. When she closes her eyes at night she is a feeling of spinning. Progressively getting worse and now constant. Even when sitting she feels off balance. She has a lot of stress in her life recently. She was started on Wellbutrin margin sees  a Veterinary surgeon. Ailing is worse if she is more active. Bending down aggravates the feeling. Turning over in bed or looking up to not aggravate the problem. She has 3-4 headache days per week. Ibuprofen helps. She does not have a history of migraines. She has no ear symptoms. Hearing seems to be fine. EKG and blood work were normal. A medication for dizziness did not help. Reviewed physical exam which included normal vital signs BP 131/87, BMI 25, exam normal including appearance, ears: External ear normal, external auditory canal normal without substantial cerumen, tympanic membrane intact, middle ear aerated, hearing understands normal conversational speech, nose normal, mouth normal, oropharynx was normal with no masses or leukoplakia, no thyromegaly, lymphatics, salivary glands, facial strength and cranial nerves II through XII intact. Etiology was unclear. She feels her dizziness is due to stress and anxiety and this seems to be the most likely cause. She has no ear symptoms and reports normal hearing. Brain MRI was ordered.   MRI 10/2015 of the brain normal, showed No acute intracranial abnormalities including mass lesion or mass effect, hydrocephalus, extra-axial fluid collection, midline shift, hemorrhage, or acute infarction, large ischemic events (personally reviewed images)  BMP normal including creatinine 0.87  Review of Systems: Patient complains of symptoms per HPI as well as the following symptoms: Chest pain, shortness of breath, headache, dizziness, depression, anxiety, not enough sleep. Pertinent negatives per HPI. All others negative.   Social History   Social History  . Marital status: Divorced    Spouse name: N/A  . Number of children: 3  . Years of education: 14   Occupational History  . [employer]  Social History Main Topics  . Smoking status: Current Every Day Smoker    Packs/day: 0.50    Types: Cigarettes  . Smokeless tobacco: Never Used  . Alcohol use Yes      Comment: 2-3 times per year  . Drug use: No  . Sexual activity: Not on file   Other Topics Concern  . Not on file   Social History Narrative   Lives w/ friend and son   Caffeine use: 1-2 cups per day of coffee/tea   No soda    Family History  Problem Relation Age of Onset  . Mental illness Sister   . Heart disease Brother     Past Medical History:  Diagnosis Date  . Anxiety   . Depression   . Dizziness     Past Surgical History:  Procedure Laterality Date  . slip disk Right   . SPINE SURGERY     L4-5, "shaved bone off nerve" per pt    Current Outpatient Prescriptions  Medication Sig Dispense Refill  . buPROPion (WELLBUTRIN SR) 150 MG 12 hr tablet Take 150 mg by mouth 2 (two) times daily.    . BUSPIRONE HCL PO Take 5 mg by mouth daily.    . traMADol (ULTRAM) 50 MG tablet Take by mouth every 6 (six) hours as needed.     No current facility-administered medications for this visit.     Allergies as of 11/26/2015  . (No Known Allergies)    Vitals: BP 114/69 (BP Location: Right Wrist, Patient Position: Sitting, Cuff Size: Normal)   Pulse 94   Ht 5' (1.524 m)   Wt 141 lb 6.4 oz (64.1 kg)   BMI 27.62 kg/m  Last Weight:  Wt Readings from Last 1 Encounters:  11/26/15 141 lb 6.4 oz (64.1 kg)   Last Height:   Ht Readings from Last 1 Encounters:  11/26/15 5' (1.524 m)    Physical exam: Exam: Gen: NAD, conversant, well nourised, well groomed                     CV: RRR, no MRG. No Carotid Bruits. No peripheral edema, warm, nontender Eyes: Conjunctivae clear without exudates or hemorrhage  Neuro: Detailed Neurologic Exam  Speech:    Speech is normal; fluent and spontaneous with normal comprehension.  Cognition:    The patient is oriented to person, place, and time;     recent and remote memory intact;     language fluent;     normal attention, concentration,     fund of knowledge Cranial Nerves:    The pupils are equal, round, and reactive to light.  The fundi are normal and spontaneous venous pulsations are present. Visual fields are full to finger confrontation. Extraocular movements are intact. Trigeminal sensation is intact and the muscles of mastication are normal. The face is symmetric. The palate elevates in the midline. Hearing intact. Voice is normal. Shoulder shrug is normal. The tongue has normal motion without fasciculations.   Coordination:    Normal finger to nose and heel to shin. Normal rapid alternating movements.   Gait:    Heel-toe and tandem gait are normal.   Motor Observation:    No asymmetry, no atrophy, and no involuntary movements noted. Tone:    Normal muscle tone.    Posture:    Posture is normal. normal erect    Strength:    Strength is V/V in the upper and lower limbs.      Sensation:  intact to LT     Reflex Exam:  DTR's:    Deep tendon reflexes in the upper and lower extremities are normal bilaterally.   Toes:    The toes are downgoing bilaterally.   Clonus:    Clonus is absent.  Dix-hallpike and Head thrust maneuvers negative but patient reports worsened dizziness    Assessment/Plan:  40 year old patient with chronic dizziness and vertigo in the setting of stress. MRI of the brain unremarkable. Neurologic exam unremarkable. Had a long discussion with patient about vestibular-ocular system. Dix-Hallpike and head thrust maneuvers in the office today were negative but patient reports she said felt dizzy and is exacerbated her dizziness. I do feel that vestibular therapy would be the best for this patient and continue to follow up with psychiatry and therapy for her stress and anxiety. Headaches sound tension type.  Discussed: To prevent or relieve headaches, try the following: Cool Compress. Lie down and place a cool compress on your head.  Avoid headache triggers. If certain foods or odors seem to have triggered your migraines in the past, avoid them. A headache diary might help you identify  triggers.  Include physical activity in your daily routine. Try a daily walk or other moderate aerobic exercise.  Manage stress. Find healthy ways to cope with the stressors, such as delegating tasks on your to-do list.  Practice relaxation techniques. Try deep breathing, yoga, massage and visualization.  Eat regularly. Eating regularly scheduled meals and maintaining a healthy diet might help prevent headaches. Also, drink plenty of fluids.  Follow a regular sleep schedule. Sleep deprivation might contribute to headaches Consider biofeedback. With this mind-body technique, you learn to control certain bodily functions - such as muscle tension, heart rate and blood pressure - to prevent headaches or reduce headache pain.    Proceed to emergency room if you experience new or worsening symptoms or symptoms do not resolve, if you have new neurologic symptoms or if headache is severe, or for any concerning symptom.    Naomie Dean, MD  [facility] [address] [place], [place] [address]  Phone [phone] Fax [phone]

## 2015-11-26 NOTE — Patient Instructions (Signed)
Remember to drink plenty of fluid, eat healthy meals and do not skip any meals. Try to eat protein with a every meal and eat a healthy snack such as fruit or nuts in between meals. Try to keep a regular sleep-wake schedule and try to exercise daily, particularly in the form of walking, 20-30 minutes a day, if you can.   Vestibular therapy  I would like to see you back if needed, sooner if we need to. Please call us with any interim questions, concerns, problems, updates or refill requests.   Our phone number is [phone]. We also have an after hours call service for urgent matters and there is a physician on-call for urgent questions. For any emergencies you know to call 911 or go to the nearest emergency room

## 2015-12-07 ENCOUNTER — Telehealth: Payer: Self-pay | Admitting: Neurology

## 2015-12-07 DIAGNOSIS — R42 Dizziness and giddiness: Secondary | ICD-10-CM

## 2015-12-07 NOTE — Telephone Encounter (Signed)
Called and LVM for pt that I placed referral and told her to call if she does not hear about scheduling. Advised message was also sent to our referral coordinator. Gave [facility] phone number if she has further questions.

## 2015-12-07 NOTE — Telephone Encounter (Signed)
Patient called to follow up on referral for PT for vestibular disorder. Please call [phone].

## 2015-12-07 NOTE — Telephone Encounter (Signed)
Order was sent [facility] has order and they will call patient to schedule. Order sent. Thanks Annabelle Harmanana.

## 2015-12-07 NOTE — Telephone Encounter (Signed)
Did not see referral in EPIC. I placed referral per Dr Lucia GaskinsAhern last OV note.

## 2015-12-17 ENCOUNTER — Encounter: Payer: Self-pay | Admitting: Physical Therapy

## 2015-12-17 ENCOUNTER — Ambulatory Visit: Payer: 59 | Attending: Neurology | Admitting: Physical Therapy

## 2015-12-17 DIAGNOSIS — R42 Dizziness and giddiness: Secondary | ICD-10-CM | POA: Diagnosis present

## 2015-12-17 DIAGNOSIS — H8111 Benign paroxysmal vertigo, right ear: Secondary | ICD-10-CM

## 2015-12-17 DIAGNOSIS — H8112 Benign paroxysmal vertigo, left ear: Secondary | ICD-10-CM

## 2015-12-17 NOTE — Therapy (Signed)
[facility] [address] [place], [place], [address] Phone: [phone]   Fax:  [phone]  Physical Therapy Evaluation  Patient Details  Name: Alicia ReichertMelissa D Moss MRN: [id number] Date of Birth: May 04, 1975 Referring Provider: Naomie DeanAntonia Ahern, MD  Encounter Date: 12/17/2015      PT End of Session - 12/17/15 1248    Visit Number 1   Number of Visits 7  eval + 6 visits   Date for PT Re-Evaluation 01/16/16   Authorization Type UHC   PT Start Time 1015   PT Stop Time 1102   PT Time Calculation (min) 47 min   Activity Tolerance Patient tolerated treatment well   Behavior [facility] for tasks assessed/performed      Past Medical History:  Diagnosis Date  . Anxiety   . Depression   . Dizziness     Past Surgical History:  Procedure Laterality Date  . slip disk Right   . SPINE SURGERY     L4-5, "shaved bone off nerve" per pt    There were no vitals filed for this visit.       Subjective Assessment - 12/17/15 1017    Subjective Onset of dizziness last fall. Started very mild and intermittent (1x/week) with increasing frequency and severity since then. Pt recently stopped working. Pt feels as though dizziness is worse with activity; better with rest/head stationary. Symptoms also exacerbated by loud and busy environments.  Neurologic exam unremarkable. Recent MRI of brain unremarkable. Pt did sustain 1 fall last winter when walking on ice; does not recall head trauma..    Pertinent History PMH significant for: L4-5 surgery due to DDD (2014), depression, anxiety, headaches, dizziness   Diagnostic tests MRI of the brain unremarkable. EKG unremarkable.    Currently in Pain? No/denies            [facility] PT Assessment - 12/17/15 0001      Assessment   Medical Diagnosis Dizziness, vertigo   Referring Provider Naomie DeanAntonia Ahern, MD   Onset Date/Surgical Date 01/01/15     Precautions   Precautions None      Restrictions   Weight Bearing Restrictions No     Balance Screen   Has the patient fallen in the past 6 months No   Has the patient had a decrease in activity level because of a fear of falling?  Yes   Is the patient reluctant to leave their home because of a fear of falling?  No     Home Environment   Living Environment Private residence   Living Arrangements Spouse/significant other;Children  816 y/o son with disability   Type of Home House   Home Access Ramped entrance   Home Layout One level     Prior Function   Level of Independence Independent   Vocation Full time employment   Vocation Requirements Currently on FMLA; works at an [employer] company            Vestibular Assessment - 12/17/15 0001      Vestibular Assessment   General Observation Note that pt did report spinning sensation with eyes closed on initial consult with ENT (6/30), per chart.     Symptom Behavior   Type of Dizziness Imbalance  and head pressure   Frequency of Dizziness daily   Duration of Dizziness hours; as long as pt moving   Aggravating Factors Activity in general;Turning body quickly;Turning head quickly;Sitting in moving car;Walking in a crowd;Comment  noisy environments  Relieving Factors Head stationary;Rest;Avoiding busy/distracting areas     Occulomotor Exam   Occulomotor Alignment Normal   Spontaneous Absent   Gaze-induced Comment  L upbeating torsional nyst in L upper quadrant   Smooth Pursuits Intact  "Not dizzy; just having trouble focusing on the pen," per pt   Saccades Intact  asymptomatic   Comment B Head Thrust Test (-) for retinal slip, but symptomatic bilaterally; however, difficult to visualize due pt tendency to close eyes tightly     Vestibulo-Occular Reflex   VOR 1 Head Only (x 1 viewing) Reproduces concordant dizziness.   VOR Cancellation Normal   Comment Convergence appears WNL.     Positional Testing   Sidelying Test Sidelying Right;Sidelying Left    Horizontal Canal Testing Horizontal Canal Right;Horizontal Canal Left     Sidelying Right   Sidelying Right Duration Nystagmus x10-15 seconds; symptomatic   Sidelying Right Symptoms Upbeat, right rotatory nystagmus     Sidelying Left   Sidelying Left Duration trace; minimal symptoms   Sidelying Left Symptoms Upbeat, left rotatory nystagmus     Horizontal Canal Right   Horizontal Canal Right Duration Approx 6-8 sec  modified to R rolling due to pain with AROM    Horizontal Canal Right Symptoms Ageotrophic;Nystagmus     Horizontal Canal Left   Horizontal Canal Left Duration trace  modified to L rolling due to pain with AROM    Horizontal Canal Left Symptoms Nystagmus  difficult to visualize direction 2/2 short duration     Positional Sensitivities   Supine to Sitting Mild dizziness   Rolling Right Mild dizziness   Rolling Left Lightheadedness                       PT Education - 12/17/15 1241    Education provided Yes   Education Details PT eval findings, goals, and POC. Explained nature of BPPV, what to expect after treatment.   Person(s) Educated Patient   Methods Explanation   Comprehension Verbalized understanding          PT Short Term Goals - 12/17/15 1300      PT SHORT TERM GOAL #1   Title STG's = LTG's           PT Long Term Goals - 12/17/15 1300      PT LONG TERM GOAL #1   Title Pt will independently perform habituation HEP to maximize functional gains made in PT.  (Target: 01/14/16)     PT LONG TERM GOAL #2   Title Positional vertigo testing will be negative to indicate resolved BPPV.  (01/14/16)     PT LONG TERM GOAL #3   Title Pt will decrease DHI score from 64 to < / = 46 to indicate significant decrease in pt-perceived disability due to dizziness.  (01/14/16)     PT LONG TERM GOAL #4   Title Assess strength and balance, if indicated, after vertigo clears.  (01/14/16)               Plan - 12/17/15 1251    Clinical  Impression Statement Pt is a 40 y/o F referred to vestibular PT to address dizziness. PMH significant for: L4-5 surgery due to DDD (2014), depression, anxiety, headache. PT evaluation reveals: dizziness, motion sensitivity, and headache which worsen with head/body movement, activity, and increased visual/auditory stimulation; impaired VOR; R Sidelying Test (+) for R upbeating, torsional nystagmus x10-12 seconds; L Sidelying Test with trace nystagmus and motion sensitivity; horizontal canal testing  with ageotropic nystagmus (symptom severity, duration of nystagmus on R > L). Based on findings, unable to rule out R posterior canalithiasis, L horizontal cupulolithiasis, possible L posterior canalithasis, and vestibular hypofunction. Pt will benefit from skilled outpatient PT 2x/week for 2 weeks followed by 1x/week for 2 weeks to address said impairments.    Rehab Potential Good   PT Frequency Other (comment)  2x/week for 2 weeks, then 1x/week for 2 weeks   PT Duration Other (comment)  see above   PT Treatment/Interventions ADLs/Self Care Home Management;Canalith Repostioning;Gait training;Stair training;Functional mobility training;Therapeutic activities;Patient/family education;Neuromuscular re-education;Balance training;Therapeutic exercise;Manual techniques;Vestibular   PT Next Visit Plan Assess for BPPV (R PC > L HC > L PC) with Dix-Hallpike and treat prn. Initiate HEP for habituation and eventually for gaze stabilization, as appropriate.   Consulted and Agree with Plan of Care Patient      Patient will benefit from skilled therapeutic intervention in order to improve the following deficits and impairments:  Pain, Dizziness  Visit Diagnosis: BPPV (benign paroxysmal positional vertigo), right - Plan: PT plan of care cert/re-cert  BPPV (benign paroxysmal positional vertigo), left - Plan: PT plan of care cert/re-cert  Dizziness and giddiness - Plan: PT plan of care cert/re-cert     Problem  List Patient Active Problem List   Diagnosis Date Noted  . Dizziness and giddiness 10/15/2015   Jorje Guild, PT, DPT [facility] [address] [place], [place], [address] Phone: [phone]   Fax:  [phone] 12/17/15, 1:05 PM  Name: GRACIANNA VINK MRN: [id number] Date of Birth: 16-Oct-1975

## 2015-12-17 NOTE — Patient Instructions (Signed)
Benign Positional Vertigo Vertigo is the feeling that you or your surroundings are moving when they are not. Benign positional vertigo is the most common form of vertigo. The cause of this condition is not serious (is benign). This condition is triggered by certain movements and positions (is positional). This condition can be dangerous if it occurs while you are doing something that could endanger you or others, such as driving.  CAUSES In many cases, the cause of this condition is not known. It may be caused by a disturbance in an area of the inner ear that helps your brain to sense movement and balance. This disturbance can be caused by a viral infection (labyrinthitis), head injury, or repetitive motion. RISK FACTORS This condition is more likely to develop in:  Women.  People who are 42 years of age or older. SYMPTOMS Symptoms of this condition usually happen when you move your head or your eyes in different directions. Symptoms may start suddenly, and they usually last for less than a minute. Symptoms may include:  Loss of balance and falling.  Feeling like you are spinning or moving.  Feeling like your surroundings are spinning or moving.  Nausea and vomiting.  Blurred vision.  Dizziness.  Involuntary eye movement (nystagmus). Symptoms can be mild and cause only slight annoyance, or they can be severe and interfere with daily life. Episodes of benign positional vertigo may return (recur) over time, and they may be triggered by certain movements. Symptoms may improve over time. DIAGNOSIS This condition is usually diagnosed by medical history and a physical exam of the head, neck, and ears. You may be referred to a health care provider who specializes in ear, nose, and throat (ENT) problems (otolaryngologist) or a provider who specializes in disorders of the nervous system (neurologist). You may have additional testing, including:  MRI.  A CT scan.  Eye movement tests. Your  health care provider may ask you to change positions quickly while he or she watches you for symptoms of benign positional vertigo, such as nystagmus. Eye movement may be tested with an electronystagmogram (ENG), caloric stimulation, the Dix-Hallpike test, or the roll test.  An electroencephalogram (EEG). This records electrical activity in your brain.  Hearing tests. TREATMENT Usually, your health care provider will treat this by moving your head in specific positions to adjust your inner ear back to normal. Surgery may be needed in severe cases, but this is rare. In some cases, benign positional vertigo may resolve on its own in 2-4 weeks. HOME CARE INSTRUCTIONS Safety  Move slowly.Avoid sudden body or head movements.  Avoid driving.  Avoid operating heavy machinery.  Avoid doing any tasks that would be dangerous to you or others if a vertigo episode would occur.  If you have trouble walking or keeping your balance, try using a cane for stability. If you feel dizzy or unstable, sit down right away.  Return to your normal activities as told by your health care provider. Ask your health care provider what activities are safe for you. General Instructions  Take over-the-counter and prescription medicines only as told by your health care provider.  Avoid certain positions or movements as told by your health care provider.  Drink enough fluid to keep your urine clear or pale yellow.  Keep all follow-up visits as told by your health care provider. This is important. SEEK MEDICAL CARE IF:  You have a fever.  Your condition gets worse or you develop new symptoms.  Your family or friends   notice any behavioral changes.  Your nausea or vomiting gets worse.  You have numbness or a "pins and needles" sensation. SEEK IMMEDIATE MEDICAL CARE IF:  You have difficulty speaking or moving.  You are always dizzy.  You faint.  You develop severe headaches.  You have weakness in your  legs or arms.  You have changes in your hearing or vision.  You develop a stiff neck.  You develop sensitivity to light.   This information is not intended to replace advice given to you by your health care provider. Make sure you discuss any questions you have with your health care provider.   Document Released: 01/24/2006 Document Revised: 01/07/2015 Document Reviewed: 08/11/2014 Elsevier Interactive Patient Education 2016 [employer]  

## 2015-12-18 ENCOUNTER — Ambulatory Visit: Payer: 59 | Admitting: Physical Therapy

## 2015-12-18 DIAGNOSIS — R42 Dizziness and giddiness: Secondary | ICD-10-CM

## 2015-12-18 DIAGNOSIS — H8111 Benign paroxysmal vertigo, right ear: Secondary | ICD-10-CM

## 2015-12-18 NOTE — Therapy (Signed)
[facility] [address] [place], [place], [address] Phone: [phone]   Fax:  [phone]  Physical Therapy Treatment  Patient Details  Name: Alicia ReichertMelissa D Moss MRN: [id number] Date of Birth: 1975/11/11 Referring Provider: Naomie DeanAntonia Ahern, MD  Encounter Date: 12/18/2015      PT End of Session - 12/18/15 0938    Visit Number 2   Number of Visits 7   Date for PT Re-Evaluation 01/16/16   Authorization Type UHC   PT Start Time 0840   PT Stop Time 0927   PT Time Calculation (min) 47 min   Activity Tolerance Patient tolerated treatment well   Behavior During Therapy [facility] for tasks assessed/performed      Past Medical History:  Diagnosis Date  . Anxiety   . Depression   . Dizziness     Past Surgical History:  Procedure Laterality Date  . slip disk Right   . SPINE SURGERY     L4-5, "shaved bone off nerve" per pt    There were no vitals filed for this visit.      Subjective Assessment - 12/18/15 0840    Subjective Pt feeling okay this morning; no dizziness at this time. Pt reports having had "a migraine all day yesterday (after PT evaluation), but this time it was in the front, and normally my headache is in the back of my head."   Pertinent History PMH significant for: L4-5 surgery due to DDD (2014), depression, anxiety, headaches, dizziness   Diagnostic tests MRI of the brain unremarkable. EKG unremarkable.    Currently in Pain? No/denies                Vestibular Assessment - 12/18/15 0001      Visual Acuity   Static Line 7   Dynamic Line 3  abnormal; symptomatic     Positional Testing   Dix-Hallpike Dix-Hallpike Right     Dix-Hallpike Right   Dix-Hallpike Right Duration Approx 10-12 seconds   Dix-Hallpike Right Symptoms Upbeat, right rotatory nystagmus     Horizontal Canal Right   Horizontal Canal Right Duration No symptoms, but ageotropic nystagmus x2 beats  modified to fast rolling  due to pain with passive rotation   Horizontal Canal Right Symptoms Ageotrophic;Nystagmus     Horizontal Canal Left   Horizontal Canal Left Duration NA  modified to fast rolling due to pain with passive rotation   Horizontal Canal Left Symptoms Normal                  Vestibular Treatment/Exercise - 12/18/15 0001      Vestibular Treatment/Exercise   Vestibular Treatment Provided Canalith Repositioning;Habituation;Gaze   Canalith Repositioning Epley Manuever Right   Habituation Exercises [employer]t Daroff   Gaze Exercises X1 Viewing Horizontal;X1 Viewing Vertical      EPLEY MANUEVER RIGHT   Number of Reps  2   Overall Response Improved Symptoms   Response Details  After initial R Epley, reassessment of L Hallpike reveals approx. 5 seconds of R upbeating torsional nystagmus, symptoms. After second R Epley, R Sidelying Test (-) and asymptomatic.      Austin MilesBrandt Daroff   Number of Reps  3  performed after R Epley x2   Symptom Description  Initially with 2/5 dizziness with R sidelying > sit, 1/5 with L sidelying to sit. Minimal symptoms (bilat) on 3rd rep.     X1 Viewing Horizontal   Foot Position standing; shoulder width   Reps 2   Comments  2 x30-sec trials; cueing for technique  short-duration dizziness for seconds after exercise     X1 Viewing Vertical   Foot Position standing; shoulder width   Reps 1   Comments 30 seconds; cueing to allow symptoms to settle prior to continuing exercise.            Balance Exercises - 12/18/15 0933      Balance Exercises: Standing   Standing Eyes Closed Wide (BOA);Foam/compliant surface;Head turns;5 reps;Other (comment)  1 pillow; horiz, vertical, diagonal head turns x10 each   Other Standing Exercises Wth EC on Airex foam with head movement, pt exhibits significant postural sway and reports reproduction of concordant imbalance experienced at times.           PT Education - 12/18/15 [phone]    Education provided Yes   Education  Details HEP initiated: Austin MilesBrandt Daroff for habituation;x1 viewing; and corner balance with vestibular reliance.   Person(s) Educated Patient   Methods Explanation;Demonstration;Verbal cues;Handout   Comprehension Verbalized understanding;Returned demonstration          PT Short Term Goals - 12/17/15 1300      PT SHORT TERM GOAL #1   Title STG's = LTG's           PT Long Term Goals - 12/17/15 1300      PT LONG TERM GOAL #1   Title Pt will independently perform habituation HEP to maximize functional gains made in PT.  (Target: 01/14/16)     PT LONG TERM GOAL #2   Title Positional vertigo testing will be negative to indicate resolved BPPV.  (01/14/16)     PT LONG TERM GOAL #3   Title Pt will decrease DHI score from 64 to < / = 46 to indicate significant decrease in pt-perceived disability due to dizziness.  (01/14/16)     PT LONG TERM GOAL #4   Title Assess strength and balance, if indicated, after vertigo clears.  (01/14/16)               Plan - 12/18/15 0943    Clinical Impression Statement Session focused on reassessment/treatment of BPPV and initiating vestibular HEP. R Dix-Hallpike (+) and symptomatic; therefore, performed R Epley maneuver x2 trials with negative R Sidelying Test post-treatment. Educated pt on [employer]t Daroff for habituation. Noted trace ageotrophic nystagmus with fast rolling to R only. Asymptomatic; therefore, did not address during this session. Both DVA and EC, head turns on Airex foam were abnormal and symptomatic. Therefore, added x1 viewing and corner balance with vestibular reliance to HEP. Pt tolerated interventions well; no headache post-session.   Rehab Potential Good   PT Frequency Other (comment)  2x/week for 2 weeks, then 1x/week for 2 weeks   PT Duration Other (comment)  see above   PT Treatment/Interventions ADLs/Self Care Home Management;Canalith Repostioning;Gait training;Stair training;Functional mobility training;Therapeutic  activities;Patient/family education;Neuromuscular re-education;Balance training;Therapeutic exercise;Manual techniques;Vestibular   PT Next Visit Plan Reassess B Dix-Hallpike and treat prn. If all positonal testing (-) and asymptomatic, discontinue habituation. Progress vestibular HEP prn; add busy background to gaze, as tolerated.   PT Home Exercise Plan 1) Brandt Daroff   2) x1 viewing in standing   3) EC, head turns on 1 pillow   Consulted and Agree with Plan of Care Patient      Patient will benefit from skilled therapeutic intervention in order to improve the following deficits and impairments:  Pain, Dizziness  Visit Diagnosis: BPPV (benign paroxysmal positional vertigo), right  Dizziness and giddiness  Problem List Patient Active Problem List   Diagnosis Date Noted  . Dizziness and giddiness 10/15/2015    Jorje Guild, PT, DPT [facility] [address] [place], [place], [address] Phone: [phone]   Fax:  [phone] 12/18/15, 9:49 AM  Name: ARIANNAH ARENSON MRN: [id number] Date of Birth: 02/06/1976

## 2015-12-18 NOTE — Patient Instructions (Signed)
Tip Card 1.The goal of habituation training is to assist in decreasing symptoms of vertigo, dizziness, or nausea provoked by specific head and body motions. 2.These exercises may initially increase symptoms; however, be persistent and work through symptoms. With repetition and time, the exercises will assist in reducing or eliminating symptoms. 3.Exercises should be stopped and discussed with the therapist if you experience any of the following: - Sudden change or fluctuation in hearing - New onset of ringing in the ears, or increase in current intensity - Any fluid discharge from the ear - Severe pain in neck or back - Extreme nausea  Copyright  VHI. All rights reserved.  Sit to Side-Lying   Sit on edge of bed. Lie down onto the right side and hold until dizziness stops, plus 20 seconds.  Return to sitting and wait until dizziness stops, plus 20 seconds.  Repeat to the left side. Repeat sequence 5 times per session. Do 2 sessions per day.  Copyright  VHI. All rights reserved.  Gaze Stabilization: Tip Card 1.Target must remain in focus, not blurry, and appear stationary while head is in motion. 2.Perform exercises with small head movements (45 to either side of midline). 3.Increase speed of head motion so long as target is in focus. 4.If you wear eyeglasses, be sure you can see target through lens (therapist will give specific instructions for bifocal / progressive lenses). 5.These exercises may provoke dizziness or nausea. Work through these symptoms. If too dizzy, slow head movement slightly. Rest between each exercise. 6.Exercises demand concentration; avoid distractions. 7.For safety, perform standing exercises close to a counter, wall, corner, or next to someone.  Copyright  VHI. All rights reserved.  Gaze Stabilization: Standing Feet Apart   Feet shoulder width apart, keeping eyes on target ("A") on wall 3 feet away (eye-level), tilt head down slightly and move head side to side  for 30 seconds. Repeat while moving head up and down for 30 seconds. Do 2 sessions per day.   Feet Apart (Compliant Surface) Head Motion - Eyes Closed    Stand with your back to a corner with a stable chair in front of you. Stand on 1 pillow with feet shoulder width apart. Close eyes and move head slowly: up and down 5 times; right to left 5 times; diagonally from up-right to down-left 5 times; and diagonally from up-left to down-right 5 times. Repeat __2-3_ times per day.  Copyright  VHI. All rights reserved.   Alicia Moss, make sure your symptoms remain < 5/10 during exercises. If symptoms exceed 5/10, stop and rest until symptoms settle.

## 2015-12-25 ENCOUNTER — Ambulatory Visit: Payer: 59 | Admitting: Physical Therapy

## 2015-12-25 DIAGNOSIS — H8112 Benign paroxysmal vertigo, left ear: Secondary | ICD-10-CM

## 2015-12-25 DIAGNOSIS — H8111 Benign paroxysmal vertigo, right ear: Secondary | ICD-10-CM | POA: Diagnosis not present

## 2015-12-25 DIAGNOSIS — R42 Dizziness and giddiness: Secondary | ICD-10-CM

## 2015-12-25 NOTE — Patient Instructions (Signed)
Tip Card 1.The goal of habituation training is to assist in decreasing symptoms of vertigo, dizziness, or nausea provoked by specific head and body motions. 2.These exercises may initially increase symptoms; however, be persistent and work through symptoms. With repetition and time, the exercises will assist in reducing or eliminating symptoms. 3.Exercises should be stopped and discussed with the therapist if you experience any of the following: - Sudden change or fluctuation in hearing - New onset of ringing in the ears, or increase in current intensity - Any fluid discharge from the ear - Severe pain in neck or back - Extreme nausea  Fast Rolling    With pillow under head, start on back. Roll  quicklyto your right side.  Hold until dizziness stops, plus 20 seconds and then roll quickly to the left side without stopping on your back.  Hold until dizziness stops, plus 20 seconds.  Repeat sequence 5 times per session. Do 2 sessions per day.  Copyright  VHI. All rights reserved.  Gaze Stabilization: Tip Card 1.Target must remain in focus, not blurry, and appear stationary while head is in motion. 2.Perform exercises with small head movements (45 to either side of midline). 3.Increase speed of head motion so long as target is in focus. 4.If you wear eyeglasses, be sure you can see target through lens (therapist will give specific instructions for bifocal / progressive lenses). 5.These exercises may provoke dizziness or nausea. Work through these symptoms. If too dizzy, slow head movement slightly. Rest between each exercise. 6.Exercises demand concentration; avoid distractions. 7.For safety, perform standing exercises close to a counter, wall, corner, or next to someone.  Copyright  VHI. All rights reserved.  Gaze Stabilization: Standing Feet Apart   Feet shoulder width apart, keeping eyes on target ("A") on wall 3 feet away (eye-level), tilt head down slightly and move head side to side for  30 seconds. Repeat while moving head up and down for 30 seconds. Do 2 sessions per day.   Feet Apart (Compliant Surface) Head Motion - Eyes Closed    Stand with your back to a corner with a stable chair in front of you. Stand on 1 pillow with feet shoulder width apart. Close eyes and move head slowly: up and down 5 times; right to left 5 times; diagonally from up-right to down-left 5 times; and diagonally from up-left to down-right 5 times. Repeat __2-3_ times per day.  Copyright  VHI. All rights reserved.   Jaton, make sure your symptoms remain < 5/10 during exercises. If symptoms exceed 5/10, stop and rest until symptoms settle.

## 2015-12-26 NOTE — Therapy (Signed)
[facility] [address] [place], [place], [address] Phone: [phone]   Fax:  [phone]  Physical Therapy Treatment  Patient Details  Name: Alicia Moss MRN: [id number] Date of Birth: 1975-11-04 Referring Provider: Naomie Dean, MD  Encounter Date: 12/25/2015      PT End of Session - 12/25/15 1444    Visit Number 3   Number of Visits 7   Date for PT Re-Evaluation 01/16/16   Authorization Type UHC   PT Start Time 0931   PT Stop Time 1016   PT Time Calculation (min) 45 min   Activity Tolerance Patient tolerated treatment well   Behavior During Therapy [facility] for tasks assessed/performed      Past Medical History:  Diagnosis Date  . Anxiety   . Depression   . Dizziness     Past Surgical History:  Procedure Laterality Date  . slip disk Right   . SPINE SURGERY     L4-5, "shaved bone off nerve" per pt    There were no vitals filed for this visit.      Subjective Assessment - 12/25/15 0934    Subjective "I have a question...can my 42-year-old son's crazy outbursts make me feel dizzier? He has special needs and they just changed his meds. Yesterday was horrible with him. I had a great day - from a dizziness perspective - on Friday, Saturday was great, but today I feel like I'm in a fog."   Pertinent History PMH significant for: L4-5 surgery due to DDD (2014), depression, anxiety, headaches, dizziness   Diagnostic tests MRI of the brain unremarkable. EKG unremarkable.    Currently in Pain? No/denies        12/25/15 0001  Positional Testing  Sidelying Test Sidelying Right;Sidelying Left  Horizontal Canal Testing Horizontal Canal Right Intensity;Horizontal Canal Left Intensity  Sidelying Right  Sidelying Right Duration Approx. 10 seconds (2/5 dizziness)  Sidelying Right Symptoms Upbeat, right rotatory nystagmus  Sidelying Left  Sidelying Left Duration NA and asymptomatic  Sidelying Left Symptoms  No nystagmus  Horizontal Canal Right  Horizontal Canal Right Duration approx. 5 seconds  Horizontal Canal Right Symptoms Ageotrophic;Nystagmus  Horizontal Canal Left  Horizontal Canal Left Duration approx.  seconds  Horizontal Canal Left Symptoms Ageotrophic;Nystagmus  Horizontal Canal Right Intensity  Horizontal Canal Right Intensity Mild  Horizontal Canal Left Intensity  Horizontal Canal Left Intensity (asymptomatic)  Left Intensity Comment NA               12/26/15 0001  Vestibular Treatment/Exercise  Vestibular Treatment Provided Canalith Repositioning;Habituation;Gaze  Canalith Repositioning Epley Manuever Right;Comment;Canal Roll Left  Habituation Exercises Animal nutritionist  Gaze Exercises X1 Viewing Horizontal;X1 Viewing Vertical  EPLEY MANUEVER RIGHT  Number of Reps  1  Overall Response Improved Symptoms  Response Details  Reassessment of R sidelying test reveals agetrophic nystagmus (suspect confounded by [facility] BPPV).  Canal Roll Left  Number of Reps  1 (modified to L SL > supine > R SL due to c-spine stiffness)  Overall Response  Improved Symptoms  Response Details  Did not formally reassess due to time constraint.  Austin Miles  Number of Reps  1  Symptom Description  Pt with effective within-session carryover.  Horizontal Roll  Number of Reps  3  Symptom Description  fast rolling x3 trials with PT providing education/cueing for technique, as needed, with effective return demo.  X1 Viewing Horizontal  Foot Position standing  Reps 1  Comments 1 x30 seconds; effective  between-session carryover.  X1 Viewing Vertical  Foot Position standing; shoulder width  Reps 1  Comments 30 seconds; cueing to allow symptoms to settle prior to continuing exercise.           [facility] - 12/26/15 0001      Transfers   Transfers --                PT Education - 12/25/15 1313    Education provided Yes   Education Details HEP: modified  Austin MilesBrandt Daroff to horizontal rolling; see Pt Instructions.    Person(s) Educated Patient   Methods Demonstration;Explanation;Verbal cues;Handout   Comprehension Verbalized understanding;Returned demonstration          PT Short Term Goals - 12/17/15 1300      PT SHORT TERM GOAL #1   Title STG's = LTG's           PT Long Term Goals - 12/17/15 1300      PT LONG TERM GOAL #1   Title Pt will independently perform habituation HEP to maximize functional gains made in PT.  (Target: 01/14/16)     PT LONG TERM GOAL #2   Title Positional vertigo testing will be negative to indicate resolved BPPV.  (01/14/16)     PT LONG TERM GOAL #3   Title Pt will decrease DHI score from 64 to < / = 46 to indicate significant decrease in pt-perceived disability due to dizziness.  (01/14/16)     PT LONG TERM GOAL #4   Title Assess strength and balance, if indicated, after vertigo clears.  (01/14/16)               Plan - 12/26/15 1301    Clinical Impression Statement R posterior canal BPPV appears to have cleared. Horizontal canal testing reveals ongoing ageotropic nystagmus, vertiginous symptoms on R > L. Unable to rule out ongoing L HC cupulolithiasis. Performed L Gufoni x2 trials, then noted short-duration geotropic nystagmus with assessment of L HC. Treated with L Canal Roll; did not reassess afterward due to time constraint.  Modified habituation HEP from [facility] to horizontal rolling.   Rehab Potential Good   PT Frequency Other (comment)  2x/week for 2 weeks, then 1x/week for 2 weeks   PT Duration Other (comment)  see above   PT Treatment/Interventions ADLs/Self Care Home Management;Canalith Repostioning;Gait training;Stair training;Functional mobility training;Therapeutic activities;Patient/family education;Neuromuscular re-education;Balance training;Therapeutic exercise;Manual techniques;Vestibular   PT Next Visit Plan [facility]ess for [facility] and treat prn. Consider Kim maneuver.  Progress  vestibular HEP prn; add busy background to gaze, as tolerated.   PT Home Exercise Plan 1) fast rolling   2) x1 viewing in standing   3) EC, head turns on 1 pillow   Consulted and Agree with Plan of Care Patient      Patient will benefit from skilled therapeutic intervention in order to improve the following deficits and impairments:  Pain, Dizziness  Visit Diagnosis: Dizziness and giddiness  BPPV (benign paroxysmal positional vertigo), left     Problem List Patient Active Problem List   Diagnosis Date Noted  . Dizziness and giddiness 10/15/2015   .Jorje GuildBlair Mykal Kirchman, PT, DPT [facility] [address] [place], [place], [address] Phone: [phone]   Fax:  [phone] 12/26/15, 1:10 PM  Name: Alicia Moss MRN: [id number] Date of Birth: [birth year]

## 2016-01-01 ENCOUNTER — Ambulatory Visit: Payer: 59 | Attending: Neurology | Admitting: Physical Therapy

## 2016-01-01 DIAGNOSIS — R42 Dizziness and giddiness: Secondary | ICD-10-CM | POA: Diagnosis present

## 2016-01-01 DIAGNOSIS — H8112 Benign paroxysmal vertigo, left ear: Secondary | ICD-10-CM | POA: Diagnosis present

## 2016-01-01 NOTE — Patient Instructions (Addendum)
Tip Card 1.The goal of habituation training is to assist in decreasing symptoms of vertigo, dizziness, or nausea provoked by specific head and body motions. 2.These exercises may initially increase symptoms; however, be persistent and work through symptoms. With repetition and time, the exercises will assist in reducing or eliminating symptoms. 3.Exercises should be stopped and discussed with the therapist if you experience any of the following: - Sudden change or fluctuation in hearing - New onset of ringing in the ears, or increase in current intensity - Any fluid discharge from the ear - Severe pain in neck or back - Extreme nausea  Fast Rolling                With pillow under head, start on back. Roll  quicklyto your right side.  Hold until dizziness stops, plus 20 seconds and then roll quickly to the left side without stopping on your back.  Hold until dizziness stops, plus 20 seconds.  Repeat sequence 5 times per session. Do 2 sessions per day.  Sit to Side-Lying   Sit on edge of bed. Lie down onto the right side and hold until dizziness stops, plus 20 seconds.  Return to sitting and wait until dizziness stops, plus 20 seconds.  Repeat to the left side. Repeat sequence 5 times per session. Do 2 sessions per day.  Copyright  VHI. All rights reserved. Gaze Stabilization: Tip Card 1.Target must remain in focus, not blurry, and appear stationary while head is in motion. 2.Perform exercises with small head movements (45 to either side of midline). 3.Increase speed of head motion so long as target is in focus. 4.If you wear eyeglasses, be sure you can see target through lens (therapist will give specific instructions for bifocal / progressive lenses). 5.These exercises may provoke dizziness or nausea. Work through these symptoms. If too dizzy, slow head movement slightly. Rest between each exercise. 6.Exercises demand concentration; avoid distractions. 7.For safety, perform standing  exercises close to a counter, wall, corner, or next to someone.  Copyright  VHI. All rights reserved. Gaze Stabilization: Standing Feet Apart   Feet shoulder width apart, keeping eyes on target ("A") on wall 3 feet away (eye-level), tilt head down slightly and move head side to side for 45 seconds. Repeat while moving head up and down for 45 seconds. Do 2 sessions per day.  Walking Head Turn    Standing next to your countertop, walk the length of your counter while turning head right to left (2 steps with head to right, 2 steps with head to left); repeat this exercise while turning head up/down in the same manner. Touch countertop if necessary to keep balance. Practice each for 2-3 minutes per day.

## 2016-01-01 NOTE — Therapy (Addendum)
[facility] [address] [place], [place], [address] Phone: [phone]   Fax:  [phone]  Physical Therapy Treatment and Discharge Summary  Patient Details  Name: Alicia PAT MRN: [id number] Date of Birth: 05/19/1975 Referring Provider: Sarina Ill, MD  Encounter Date: 01/01/2016      PT End of Session - 01/01/16 1057    Visit Number 4   Number of Visits 7   Date for PT Re-Evaluation 01/16/16   Authorization Type [employer]   PT Start Time 0853   PT Stop Time 0934   PT Time Calculation (min) 41 min   Activity Tolerance Patient tolerated treatment well   Behavior During Therapy [facility] for tasks assessed/performed      Past Medical History:  Diagnosis Date  . Anxiety   . Depression   . Dizziness     Past Surgical History:  Procedure Laterality Date  . slip disk Right   . SPINE SURGERY     L4-5, "shaved bone off nerve" per pt    There were no vitals filed for this visit.      Subjective Assessment - 01/01/16 0853    Subjective "I've felt kind of out of it all week...but I think it's from all that happened last week."   Pertinent History PMH significant for: L4-5 surgery due to DDD (2014), depression, anxiety, headaches, dizziness   Diagnostic tests MRI of the brain unremarkable. EKG unremarkable.    Currently in Pain? No/denies                Vestibular Assessment - 01/01/16 0001      Positional Testing   Horizontal Canal Testing Horizontal Canal Right;Horizontal Canal Left;Horizontal Canal Right Intensity;Horizontal Canal Left Intensity     Horizontal Canal Right   Horizontal Canal Right Duration agetrophic nystagmus x5 beats   Horizontal Canal Right Symptoms Ageotrophic;Nystagmus     Horizontal Canal Left   Horizontal Canal Left Duration NA   Horizontal Canal Left Symptoms Normal     Horizontal Canal Right Intensity   Horizontal Canal Right Intensity Mild     Horizontal Canal Left  Intensity   Left Intensity Comment NA; no symptoms     Positional Sensitivities   Rolling Right Lightheadedness   Rolling Left No dizziness   Positional Sensitivities Comments 1/5 symptoms with both R and L sidelying > sit.                  Vestibular Treatment/Exercise - 01/01/16 0001      Vestibular Treatment/Exercise   Vestibular Treatment Provided Canalith Repositioning;Habituation;Gaze   Canalith Repositioning Comment   Habituation Exercises [employer]   Gaze Exercises X1 Viewing Horizontal;X1 Viewing Vertical     OTHER   Comment Performed L Cupulolith Repositioning Maneuver as described by Liborio Nixon al (2012) with each position held x2 minutes, vibrator at L mastoid process for initial 30 seconds of positions 1 and 4. 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      Nestor Lewandowsky   Number of Reps  2   Symptom Description  Reviewed Nestor Lewandowsky x2 reps and added back to HEP due to positional sensitivity with R/L sidelying > sit.     X1 Viewing Horizontal   Foot Position standing; feet shoulder width   Reps 1   Comments 45 seconds with effective between-session carryover of technique     X1 Viewing Vertical   Foot Position standing; feet shoulder width  Reps 1   Comments 45 seconds            Balance Exercises - 01/01/16 1055      Balance Exercises: Standing   Standing Eyes Opened Narrow base of support (BOS);Foam/compliant surface;Head turns;5 reps  1 pillow; horiz, vertical head turns x5 each; no difficulty   Standing Eyes Closed Narrow base of support (BOS);Head turns;Foam/compliant surface;5 reps  1 pillow; horiz, vertical head turns x5 each; min difficulty   Gait with Head Turns Forward;Retro;Intermittent upper extremity support;4 reps;Other (comment)  with horiz, then vertical head turns 2 x10' each           PT Education - 01/01/16 1052    Education provided Yes   Education Details HEP:  both Nestor Lewandowsky and fast rolling for habituation; removed corner balance and added gait with head turns. Educated pt on sleepingon L side for prolonged positioning.   Person(s) Educated Patient   Methods Explanation;Demonstration;Verbal cues;Handout   Comprehension Verbalized understanding;Returned demonstration          PT Short Term Goals - 12/17/15 1300      PT SHORT TERM GOAL #1   Title STG's = LTG's           PT Long Term Goals - 12/17/15 1300      PT LONG TERM GOAL #1   Title Pt will independently perform habituation HEP to maximize functional gains made in PT.  (Target: 01/14/16)     PT LONG TERM GOAL #2   Title Positional vertigo testing will be negative to indicate resolved BPPV.  (01/14/16)     PT LONG TERM GOAL #3   Title Pt will decrease DHI score from 64 to < / = 46 to indicate significant decrease in pt-perceived disability due to dizziness.  (01/14/16)     PT LONG TERM GOAL #4   Title Assess strength and balance, if indicated, after vertigo clears.  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 Reassessment of R HC reveals trace ageotropic nystagmus, but asymptomatic. Instructed pt to continue gast rolling, educated pt on prolonged positioning, added Brandt Daroff back to HEP due to positional sensitivity with R/L sidelying > sit, and progressed vestibular HEP.    Rehab Potential Good   PT Frequency Other (comment)  2x/week for 2 weeks, then 1x/week for 2 weeks   PT Duration Other (comment)  see above   PT Treatment/Interventions ADLs/Self Care Home Management;Canalith Repostioning;Gait training;Stair training;Functional mobility training;Therapeutic activities;Patient/family education;Neuromuscular re-education;Balance training;Therapeutic exercise;Manual techniques;Vestibular   PT  Next Visit Plan If pt continues to report motion sensitivity with SL > sit, consider assessing orthostatic vital signs. Assess for [facility] BPPV (L cupulo) and treat prn.   PT Home Exercise Plan --   Consulted and Agree with Plan of Care Patient      Patient will benefit from skilled therapeutic intervention in order to improve the following deficits and impairments:  Pain, Dizziness  Visit Diagnosis: Dizziness and giddiness  BPPV (benign paroxysmal positional vertigo), left     Problem List Patient Active Problem List   Diagnosis Date Noted  . Dizziness and giddiness 10/15/2015    Billie Ruddy, PT, DPT [facility] [address] [place], [place], [address] Phone: [phone]   Fax:  [phone] 01/01/16,  11:01 AM  Name: MOSETTA Moss MRN: [id number] Date of Birth: 1975/09/05   PHYSICAL THERAPY DISCHARGE SUMMARY  Visits from Start of Care: 4  Current functional level related to goals / functional outcomes: Unknown, as patient did not return to PT after initial 4 sessions.    Remaining deficits: Unknown, as patient did not return to PT after initial 4 sessions.    Education / Equipment: See above.  Plan: Patient agrees to discharge.  Patient goals were not met. Patient is being discharged due to not returning since the last visit.  ?????        Billie Ruddy, PT, DPT [facility] [address] [place], [place], [address] Phone: [phone]   Fax:  [phone] 03/22/16, 8:36 AM

## 2016-01-06 ENCOUNTER — Ambulatory Visit: Payer: 59 | Admitting: Physical Therapy

## 2016-01-27 ENCOUNTER — Encounter: Payer: Self-pay | Admitting: Women's Health

## 2016-01-27 ENCOUNTER — Encounter: Payer: 59 | Admitting: Physical Therapy

## 2016-01-27 ENCOUNTER — Ambulatory Visit (INDEPENDENT_AMBULATORY_CARE_PROVIDER_SITE_OTHER): Payer: 59 | Admitting: Women's Health

## 2016-01-27 VITALS — BP 117/72 | Ht 60.0 in | Wt 145.0 lb

## 2016-01-27 DIAGNOSIS — Z01419 Encounter for gynecological examination (general) (routine) without abnormal findings: Secondary | ICD-10-CM | POA: Diagnosis not present

## 2016-01-27 DIAGNOSIS — Z23 Encounter for immunization: Secondary | ICD-10-CM

## 2016-01-27 DIAGNOSIS — Z13220 Encounter for screening for lipoid disorders: Secondary | ICD-10-CM | POA: Diagnosis not present

## 2016-01-27 DIAGNOSIS — Z1329 Encounter for screening for other suspected endocrine disorder: Secondary | ICD-10-CM | POA: Diagnosis not present

## 2016-01-27 LAB — LIPID PANEL
CHOL/HDL RATIO: 4 ratio (ref <=5.0)
Cholesterol: 174 mg/dL (ref 125–200)
HDL: 43 mg/dL — AB (ref >=46)
LDL Cholesterol: 110 mg/dL (ref <130)
Triglycerides: 106 mg/dL (ref <150)
VLDL: 21 mg/dL (ref <30)

## 2016-01-27 LAB — TSH: TSH: 1.54 mIU/L

## 2016-01-27 NOTE — Progress Notes (Signed)
Alicia ReichertMelissa D Mocarski 06-May-1975 [id number]    History:    Presents for new patient annual exam.  A[facility]he[facility] on [facility]. Received a tetanus shot last month at primary care. Has struggled with vertigo over the past few months, normal brain MRI 10/2015. Currently being treated with Wellbutrin for depression and situational stress per primary care and counselor. Normal CBC and CMP. Reports normal Pap history. Has not had a mammogram.  Past medical history, past surgical history, family history and social history were all reviewed and documented in the EPIC chart. 2 sons ages 547 and 9016, daughter 5325 . 840-year-old son numerous behavioral and learning issues. Mother deceased history of overdose. Father healthy.  ROS:  A ROS was performed and pertinent positives and negatives are included.  Exam:  Vitals:   01/27/16 0836  BP: 117/72  Weight: 145 lb (65.8 kg)  Height: 5' (1.524 m)   Body mass index is 28.32 kg/m.   General appearance:  Normal Thyroid:  Symmetrical, normal in size, without palpable masses or nodularity. Respiratory  Auscultation:  Clear without wheezing or rhonchi Cardiovascular  Auscultation:  Regular rate, without rubs, murmurs or gallops  Edema/varicosities:  Not grossly evident Abdominal  Soft,nontender, without masses, guarding or rebound.  Liver/spleen:  No organomegaly noted  Hernia:  None appreciated  Skin  Inspection:  Grossly normal   Breasts: Examined lying and sitting.     Right: Without masses, retractions, discharge or axillary adenopathy.     Left: Without masses, retractions, discharge or axillary adenopathy. Gentitourinary   Inguinal/mons:  Normal without inguinal adenopathy  External genitalia:  Normal  BUS/Urethra/Skene's glands:  Normal  Vagina:  Normal  Cervix:  Normal IUD strings visible  Uterus:   normal in size, shape and contour.  Midline and mobile  Adnexa/parametria:     Rt: Without masses or  tenderness.   Lt: Without masses or tenderness.  Anus and perineum: Normal  Digital rectal exam: Normal sphincter tone without palpated masses or tenderness  Assessment/Plan:  40 y.o. MWF G3 P3 for annual exam.   11/2013 Mirena IUD-amenorrhea Vertigo-primary care managing Depression-primary care managing on Wellbutrin and counseling  Plan: SBE's, annual screening mammogram, breast center information given reviewed importance of annual screen. Reviewed importance of trying to increase regular exercise brisk walk daily and increasing leisure activities encouraged. TSH, lipid panel, UA, Pap with HR HPV typing, new screening guidelines reviewed. Aware Mirena IUD is good for 5 years.    Harrington ChallengerYOUNG,Leonarda Leis J Wagner Community Memorial HospitalWHNP, 10:57 AM 01/27/2016

## 2016-01-27 NOTE — Patient Instructions (Signed)
Health Maintenance, Female Adopting a healthy lifestyle and getting preventive care can go a long way to promote health and wellness. Talk with your health care provider about what schedule of regular examinations is right for you. This is a good chance for you to check in with your provider about disease prevention and staying healthy. In between checkups, there are plenty of things you can do on your own. Experts have done a lot of research about which lifestyle changes and preventive measures are most likely to keep you healthy. Ask your health care provider for more information. WEIGHT AND DIET  Eat a healthy diet  Be sure to include plenty of vegetables, fruits, low-fat dairy products, and lean protein.  Do not eat a lot of foods high in solid fats, added sugars, or salt.  Get regular exercise. This is one of the most important things you can do for your health.  Most adults should exercise for at least 150 minutes each week. The exercise should increase your heart rate and make you sweat (moderate-intensity exercise).  Most adults should also do strengthening exercises at least twice a week. This is in addition to the moderate-intensity exercise.  Maintain a healthy weight  Body mass index (BMI) is a measurement that can be used to identify possible weight problems. It estimates body fat based on height and weight. Your health care provider can help determine your BMI and help you achieve or maintain a healthy weight.  For females 20 years of age and older:   A BMI below 18.5 is considered underweight.  A BMI of 18.5 to 24.9 is normal.  A BMI of 25 to 29.9 is considered overweight.  A BMI of 30 and above is considered obese.  Watch levels of cholesterol and blood lipids  You should start having your blood tested for lipids and cholesterol at 42 years of age, then have this test every 5 years.  You may need to have your cholesterol levels checked more often if:  Your lipid  or cholesterol levels are high.  You are older than 42 years of age.  You are at high risk for heart disease.  CANCER SCREENING   Lung Cancer  Lung cancer screening is recommended for adults 55-80 years old who are at high risk for lung cancer because of a history of smoking.  A yearly low-dose CT scan of the lungs is recommended for people who:  Currently smoke.  Have quit within the past 15 years.  Have at least a 30-pack-year history of smoking. A pack year is smoking an average of one pack of cigarettes a day for 1 year.  Yearly screening should continue until it has been 15 years since you quit.  Yearly screening should stop if you develop a health problem that would prevent you from having lung cancer treatment.  Breast Cancer  Practice breast self-awareness. This means understanding how your breasts normally appear and feel.  It also means doing regular breast self-exams. Let your health care provider know about any changes, no matter how small.  If you are in your 20s or 30s, you should have a clinical breast exam (CBE) by a health care provider every 1-3 years as part of a regular health exam.  If you are 40 or older, have a CBE every year. Also consider having a breast X-ray (mammogram) every year.  If you have a family history of breast cancer, talk to your health care provider about genetic screening.  If you   are at high risk for breast cancer, talk to your health care provider about having an MRI and a mammogram every year.  Breast cancer gene (BRCA) assessment is recommended for women who have family members with BRCA-related cancers. BRCA-related cancers include:  Breast.  Ovarian.  Tubal.  Peritoneal cancers.  Results of the assessment will determine the need for genetic counseling and BRCA1 and BRCA2 testing. Cervical Cancer Your health care provider may recommend that you be screened regularly for cancer of the pelvic organs (ovaries, uterus, and  vagina). This screening involves a pelvic examination, including checking for microscopic changes to the surface of your cervix (Pap test). You may be encouraged to have this screening done every 3 years, beginning at age 21.  For women ages 30-65, health care providers may recommend pelvic exams and Pap testing every 3 years, or they may recommend the Pap and pelvic exam, combined with testing for human papilloma virus (HPV), every 5 years. Some types of HPV increase your risk of cervical cancer. Testing for HPV may also be done on women of any age with unclear Pap test results.  Other health care providers may not recommend any screening for nonpregnant women who are considered low risk for pelvic cancer and who do not have symptoms. Ask your health care provider if a screening pelvic exam is right for you.  If you have had past treatment for cervical cancer or a condition that could lead to cancer, you need Pap tests and screening for cancer for at least 20 years after your treatment. If Pap tests have been discontinued, your risk factors (such as having a new sexual partner) need to be reassessed to determine if screening should resume. Some women have medical problems that increase the chance of getting cervical cancer. In these cases, your health care provider may recommend more frequent screening and Pap tests. Colorectal Cancer  This type of cancer can be detected and often prevented.  Routine colorectal cancer screening usually begins at 42 years of age and continues through 42 years of age.  Your health care provider may recommend screening at an earlier age if you have risk factors for colon cancer.  Your health care provider may also recommend using home test kits to check for hidden blood in the stool.  A small camera at the end of a tube can be used to examine your colon directly (sigmoidoscopy or colonoscopy). This is done to check for the earliest forms of colorectal  cancer.  Routine screening usually begins at age 50.  Direct examination of the colon should be repeated every 5-10 years through 42 years of age. However, you may need to be screened more often if early forms of precancerous polyps or small growths are found. Skin Cancer  Check your skin from head to toe regularly.  Tell your health care provider about any new moles or changes in moles, especially if there is a change in a mole's shape or color.  Also tell your health care provider if you have a mole that is larger than the size of a pencil eraser.  Always use sunscreen. Apply sunscreen liberally and repeatedly throughout the day.  Protect yourself by wearing long sleeves, pants, a wide-brimmed hat, and sunglasses whenever you are outside. HEART DISEASE, DIABETES, AND HIGH BLOOD PRESSURE   High blood pressure causes heart disease and increases the risk of stroke. High blood pressure is more likely to develop in:  People who have blood pressure in the high end   of the normal range (130-139/85-89 mm Hg).  People who are overweight or obese.  People who are African American.  If you are 38-23 years of age, have your blood pressure checked every 3-5 years. If you are 61 years of age or older, have your blood pressure checked every year. You should have your blood pressure measured twice--once when you are at a hospital or clinic, and once when you are not at a hospital or clinic. Record the average of the two measurements. To check your blood pressure when you are not at a hospital or clinic, you can use:  An automated blood pressure machine at a pharmacy.  A home blood pressure monitor.  If you are between 45 years and 39 years old, ask your health care provider if you should take aspirin to prevent strokes.  Have regular diabetes screenings. This involves taking a blood sample to check your fasting blood sugar level.  If you are at a normal weight and have a low risk for diabetes,  have this test once every three years after 42 years of age.  If you are overweight and have a high risk for diabetes, consider being tested at a younger age or more often. PREVENTING INFECTION  Hepatitis B  If you have a higher risk for hepatitis B, you should be screened for this virus. You are considered at high risk for hepatitis B if:  You were born in a country where hepatitis B is common. Ask your health care provider which countries are considered high risk.  Your parents were born in a high-risk country, and you have not been immunized against hepatitis B (hepatitis B vaccine).  You have HIV or AIDS.  You use needles to inject street drugs.  You live with someone who has hepatitis B.  You have had sex with someone who has hepatitis B.  You get hemodialysis treatment.  You take certain medicines for conditions, including cancer, organ transplantation, and autoimmune conditions. Hepatitis C  Blood testing is recommended for:  Everyone born from [birth year] through 1967.  Anyone with known risk factors for hepatitis C. Sexually transmitted infections (STIs)  You should be screened for sexually transmitted infections (STIs) including gonorrhea and chlamydia if:  You are sexually active and are younger than 42 years of age.  You are older than 42 years of age and your health care provider tells you that you are at risk for this type of infection.  Your sexual activity has changed since you were last screened and you are at an increased risk for chlamydia or gonorrhea. Ask your health care provider if you are at risk.  If you do not have HIV, but are at risk, it may be recommended that you take a prescription medicine daily to prevent HIV infection. This is called pre-exposure prophylaxis (PrEP). You are considered at risk if:  You are sexually active and do not regularly use condoms or know the HIV status of your partner(s).  You take drugs by injection.  You are sexually  active with a partner who has HIV. Talk with your health care provider about whether you are at high risk of being infected with HIV. If you choose to begin PrEP, you should first be tested for HIV. You should then be tested every 3 months for as long as you are taking PrEP.  PREGNANCY   If you are premenopausal and you may become pregnant, ask your health care provider about preconception counseling.  If you may  become pregnant, take 400 to 800 micrograms (mcg) of folic acid every day.  If you want to prevent pregnancy, talk to your health care provider about birth control (contraception). OSTEOPOROSIS AND MENOPAUSE   Osteoporosis is a disease in which the bones lose minerals and strength with aging. This can result in serious bone fractures. Your risk for osteoporosis can be identified using a bone density scan.  If you are 61 years of age or older, or if you are at risk for osteoporosis and fractures, ask your health care provider if you should be screened.  Ask your health care provider whether you should take a calcium or vitamin D supplement to lower your risk for osteoporosis.  Menopause may have certain physical symptoms and risks.  Hormone replacement therapy may reduce some of these symptoms and risks. Talk to your health care provider about whether hormone replacement therapy is right for you.  HOME CARE INSTRUCTIONS   Schedule regular health, dental, and eye exams.  Stay current with your immunizations.   Do not use any tobacco products including cigarettes, chewing tobacco, or electronic cigarettes.  If you are pregnant, do not drink alcohol.  If you are breastfeeding, limit how much and how often you drink alcohol.  Limit alcohol intake to no more than 1 drink per day for nonpregnant women. One drink equals 12 ounces of beer, 5 ounces of wine, or 1 ounces of hard liquor.  Do not use street drugs.  Do not share needles.  Ask your health care provider for help if  you need support or information about quitting drugs.  Tell your health care provider if you often feel depressed.  Tell your health care provider if you have ever been abused or do not feel safe at home.   This information is not intended to replace advice given to you by your health care provider. Make sure you discuss any questions you have with your health care provider.   Document Released: 11/01/2010 Document Revised: 05/09/2014 Document Reviewed: 03/20/2013 Elsevier Interactive Patient Education [employer].

## 2016-02-01 ENCOUNTER — Ambulatory Visit: Payer: 59 | Admitting: Physical Therapy

## 2016-02-01 LAB — PAP, TP IMAGING W/ HPV RNA, RFLX HPV TYPE 16,18/45: HPV mRNA, High Risk: NOT DETECTED

## 2016-02-24 LAB — HM HEPATITIS C SCREENING LAB: HM Hepatitis Screen: NEGATIVE

## 2016-05-30 ENCOUNTER — Other Ambulatory Visit: Payer: Self-pay | Admitting: Orthopedic Surgery

## 2016-05-30 DIAGNOSIS — M533 Sacrococcygeal disorders, not elsewhere classified: Secondary | ICD-10-CM

## 2016-06-02 ENCOUNTER — Ambulatory Visit
Admission: RE | Admit: 2016-06-02 | Discharge: 2016-06-02 | Disposition: A | Discharge status: Home / Self Care | Payer: 59 | Source: Ambulatory Visit | Attending: Orthopedic Surgery | Admitting: Orthopedic Surgery

## 2016-06-02 DIAGNOSIS — M533 Sacrococcygeal disorders, not elsewhere classified: Secondary | ICD-10-CM

## 2016-06-02 NOTE — Discharge Instructions (Signed)

## 2016-09-14 ENCOUNTER — Encounter: Payer: Self-pay | Admitting: Gynecology

## 2017-06-19 IMAGING — MR MR HEAD WO/W CM
9 of 10 series · 40 of 48 positions shown · IV contrast (Multihance 12ml)
Comparison: None.

CLINICAL DATA: Posterior headache and dizziness, daily episodes for
the last 6-8 months.

EXAM:
MRI HEAD WITHOUT AND WITH CONTRAST
TECHNIQUE: Multiplanar, multiecho pulse sequences of the brain and surrounding
structures were obtained without and with intravenous contrast.
CONTRAST:  12 cc MultiHance

[Series 5: T1 · sagittal · 4.0mm · 0.72mm/px · 5 of 25 slices shown (1 of 3)]
[im 1/25]
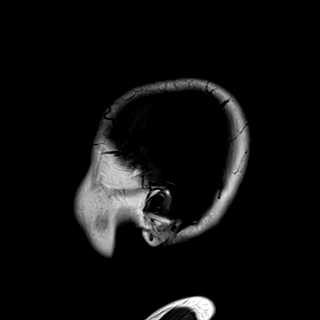
[im 7/25]
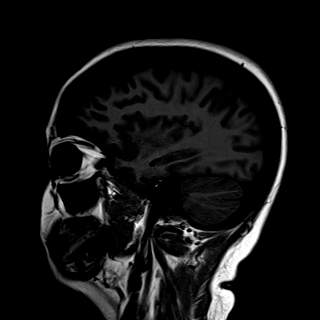
[im 13/25]
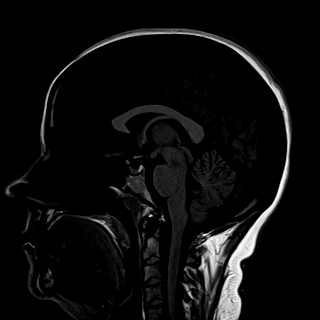
[im 19/25]
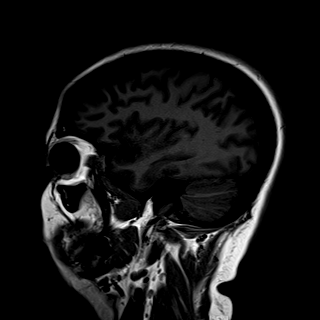
[im 25/25]
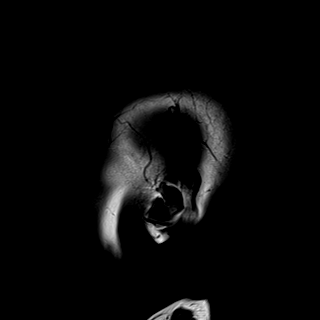

[Series 6: DWI · axial · 3.0mm · 1.44mm/px · z∈[-51,+87]mm · 13 of 74 slices shown (1 of 2)]
[im 1/74]
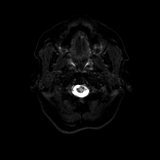
[im 7/74]
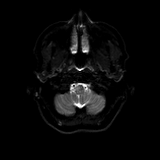
[im 13/74]
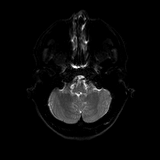
[im 19/74]
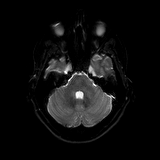
[im 25/74]
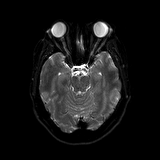
[im 31/74]
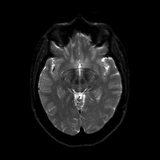
[im 37/74]
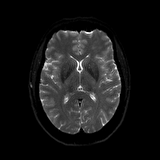
[im 43/74]
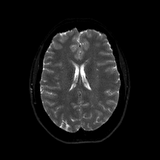
[im 49/74]
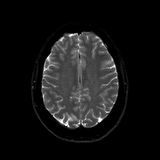
[im 55/74]
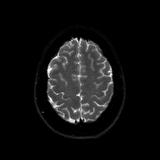
[im 61/74]
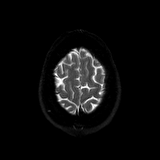
[im 67/74]
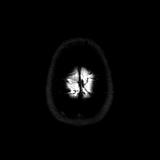
[im 74/74]
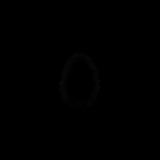

[Series 7: DWI · axial · 3.0mm · 1.44mm/px · z∈[-51,+87]mm · 6 of 37 slices shown (2 of 2)]
[im 1/37]
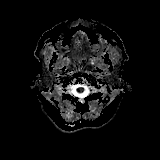
[im 8/37]
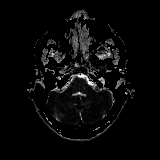
[im 15/37]
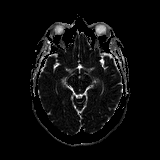
[im 22/37]
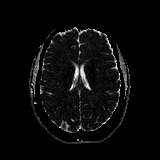
[im 29/37]
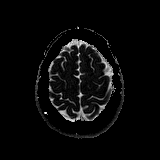
[im 37/37]
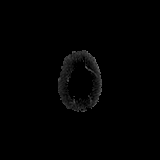

[Series 8: T2 · axial · 4.0mm · 0.36mm/px · z∈[-47,+81]mm · 4 of 26 slices shown]
[im 1/26]
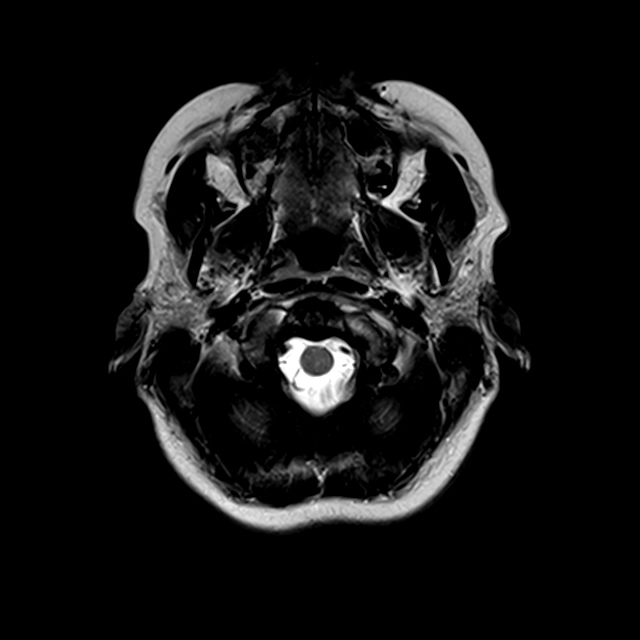
[im 9/26]
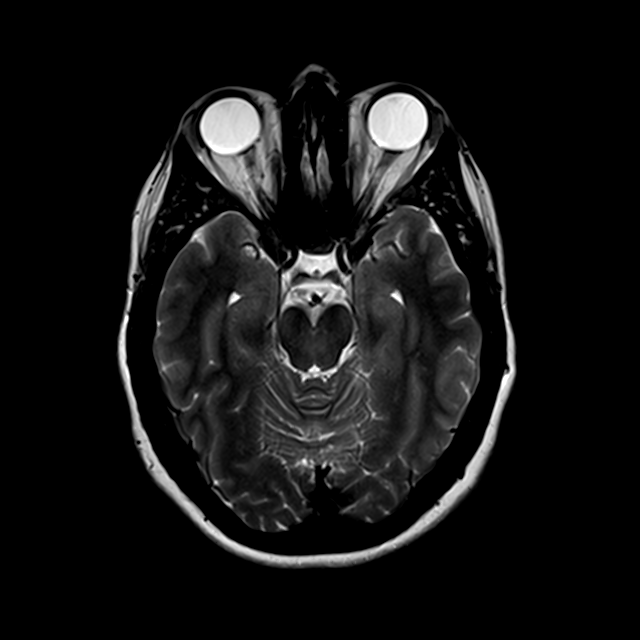
[im 17/26]
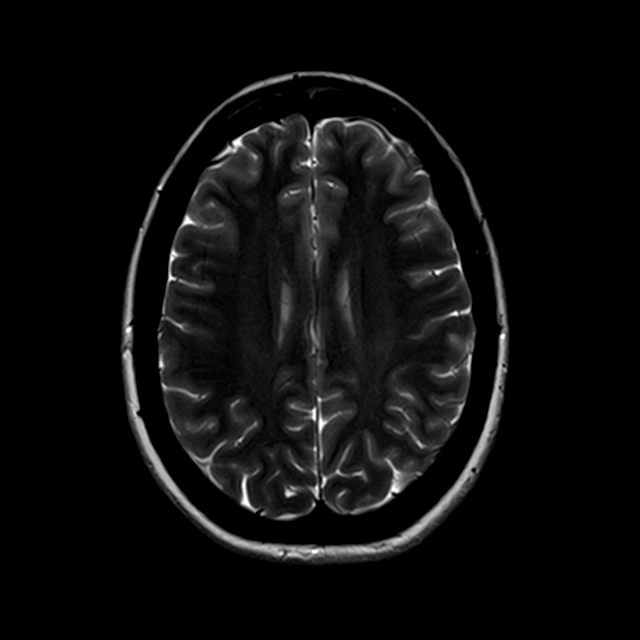
[im 26/26]
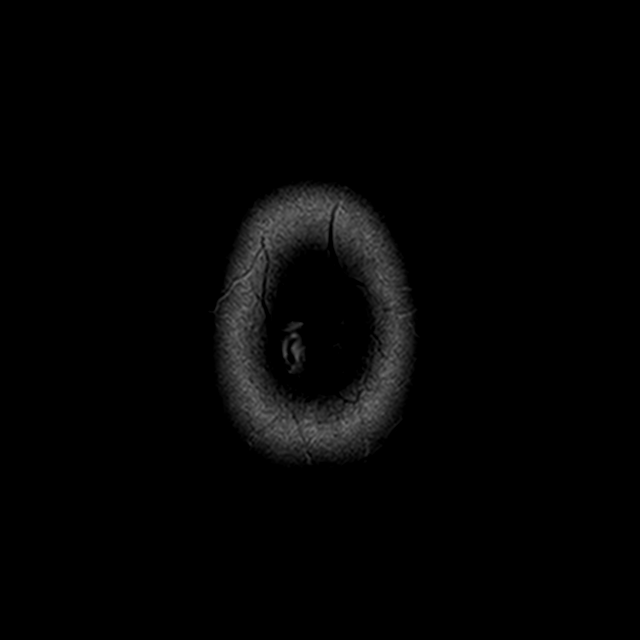

[Series 9: FLAIR · axial · 4.0mm · 0.72mm/px · z∈[-47,+81]mm · 4 of 26 slices shown]
[im 1/26]
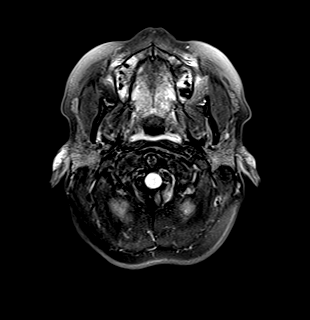
[im 9/26]
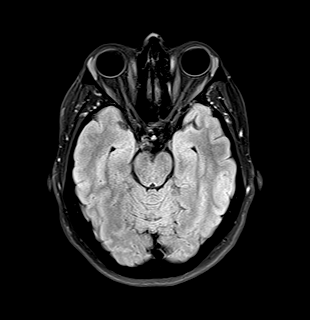
[im 17/26]
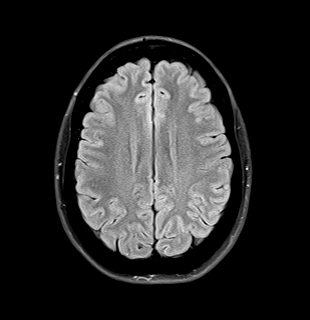
[im 26/26]
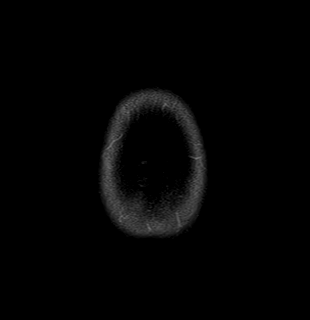

[Series 10: T1 · coronal · 2.5mm · 0.56mm/px · 2 of 11 slices shown (2 of 3)]
[im 1/11]
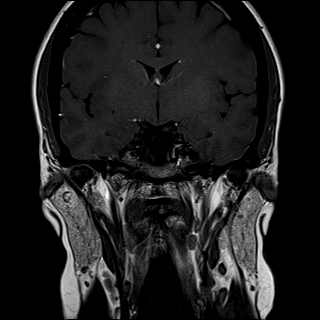
[im 11/11]
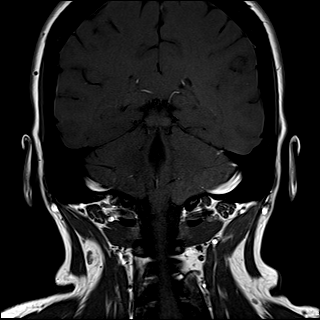

[Series 11: T1 · axial · 2.5mm · 0.59mm/px · z∈[-29,-1]mm · 2 of 11 slices shown (3 of 3)]
[im 1/11]
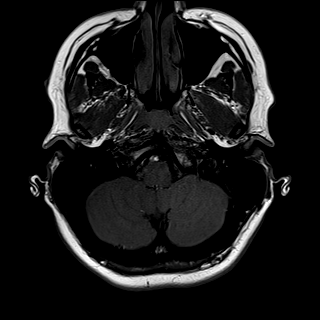
[im 11/11]
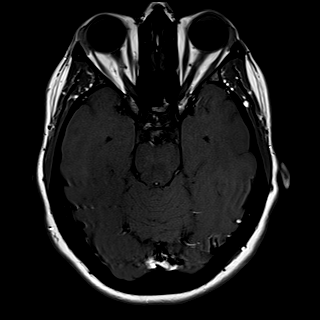

[Series 13: T1 post-contrast · coronal · 2.5mm · 0.56mm/px · 2 of 11 slices shown (1 of 2)]
[im 1/11]
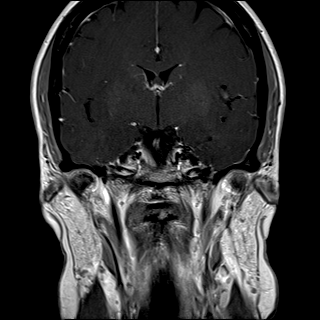
[im 11/11]
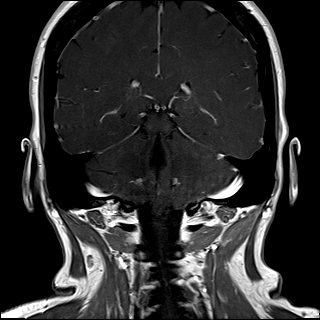

[Series 14: T1 post-contrast · axial · 2.5mm · 0.59mm/px · z∈[-29,-1]mm · 2 of 11 slices shown (2 of 2)]
[im 1/11]
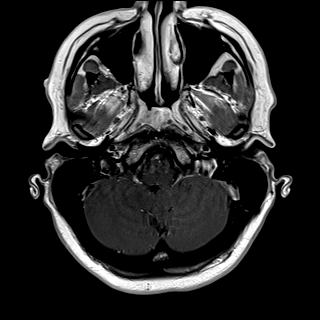
[im 11/11]
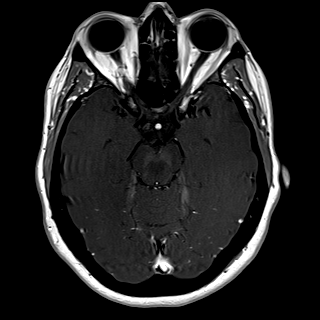

[40 of 48 positions shown; findings below may reference images not displayed]

FINDINGS: The brain has a normal appearance on all pulse sequences without
evidence of malformation, atrophy, old or acute infarction, mass
lesion, hemorrhage, hydrocephalus or extra-axial collection. No
pituitary mass. No fluid in the sinuses, middle ears or mastoids. No
skull or skullbase lesion. There is flow in the major vessels at the
base of the brain. Major venous sinuses show flow. After contrast
administration, no abnormal enhancement occurs. CP angle regions are
normal. No vestibular schwannoma.
IMPRESSION: Normal examination. No abnormality seen to explain the clinical
presentation.

## 2018-07-16 ENCOUNTER — Other Ambulatory Visit: Payer: Self-pay | Admitting: Physical Medicine and Rehabilitation

## 2018-07-16 DIAGNOSIS — M47816 Spondylosis without myelopathy or radiculopathy, lumbar region: Secondary | ICD-10-CM

## 2018-07-23 ENCOUNTER — Other Ambulatory Visit: Payer: Managed Care, Other (non HMO)

## 2018-08-20 ENCOUNTER — Other Ambulatory Visit: Payer: Managed Care, Other (non HMO)

## 2018-11-05 DIAGNOSIS — R51 Headache: Secondary | ICD-10-CM | POA: Diagnosis not present

## 2018-11-06 ENCOUNTER — Other Ambulatory Visit: Payer: Self-pay | Admitting: Family Medicine

## 2018-11-06 DIAGNOSIS — G4452 New daily persistent headache (NDPH): Secondary | ICD-10-CM

## 2019-05-31 DIAGNOSIS — M5136 Other intervertebral disc degeneration, lumbar region: Secondary | ICD-10-CM | POA: Diagnosis not present

## 2019-06-28 DIAGNOSIS — R5383 Other fatigue: Secondary | ICD-10-CM | POA: Diagnosis not present

## 2019-07-19 ENCOUNTER — Ambulatory Visit: Payer: Managed Care, Other (non HMO) | Attending: Internal Medicine

## 2019-07-19 DIAGNOSIS — Z23 Encounter for immunization: Secondary | ICD-10-CM

## 2019-07-19 NOTE — Progress Notes (Signed)
   Covid-19 Vaccination Clinic  Name:  Alicia Moss    MRN: [id number] DOB: 28-Feb-1976  07/19/2019  [name] was observed post Covid-19 immunization for 15 minutes without incident. She was provided with Vaccine Information Sheet and instruction to access the V-Safe system.   [name] was instructed to call 911 with any severe reactions post vaccine: Marland Kitchen Difficulty breathing  . Swelling of face and throat  . A fast heartbeat  . A bad rash all over body  . Dizziness and weakness   Immunizations Administered    Name Date Dose VIS Date Route   Pfizer COVID-19 Vaccine 07/19/2019  3:27 PM 0.3 mL 04/12/2019 Intramuscular   Manufacturer: [employer], [employer]   Lot: [id number]   NDC: [address]-1

## 2019-08-14 ENCOUNTER — Ambulatory Visit: Payer: Managed Care, Other (non HMO)

## 2019-10-31 DIAGNOSIS — Z419 Encounter for procedure for purposes other than remedying health state, unspecified: Secondary | ICD-10-CM | POA: Diagnosis not present

## 2019-12-01 DIAGNOSIS — Z419 Encounter for procedure for purposes other than remedying health state, unspecified: Secondary | ICD-10-CM | POA: Diagnosis not present

## 2020-01-01 DIAGNOSIS — Z419 Encounter for procedure for purposes other than remedying health state, unspecified: Secondary | ICD-10-CM | POA: Diagnosis not present

## 2020-01-31 DIAGNOSIS — Z419 Encounter for procedure for purposes other than remedying health state, unspecified: Secondary | ICD-10-CM | POA: Diagnosis not present

## 2020-03-02 DIAGNOSIS — Z419 Encounter for procedure for purposes other than remedying health state, unspecified: Secondary | ICD-10-CM | POA: Diagnosis not present

## 2020-04-01 DIAGNOSIS — Z419 Encounter for procedure for purposes other than remedying health state, unspecified: Secondary | ICD-10-CM | POA: Diagnosis not present

## 2020-04-20 ENCOUNTER — Other Ambulatory Visit: Payer: Self-pay

## 2020-04-20 ENCOUNTER — Other Ambulatory Visit: Payer: Self-pay | Admitting: Nurse Practitioner

## 2020-04-20 ENCOUNTER — Ambulatory Visit
Admission: RE | Admit: 2020-04-20 | Discharge: 2020-04-20 | Disposition: A | Discharge status: Home / Self Care | Payer: Managed Care, Other (non HMO) | Source: Ambulatory Visit | Attending: Nurse Practitioner | Admitting: Nurse Practitioner

## 2020-04-20 DIAGNOSIS — Z01818 Encounter for other preprocedural examination: Secondary | ICD-10-CM

## 2020-05-02 DIAGNOSIS — Z419 Encounter for procedure for purposes other than remedying health state, unspecified: Secondary | ICD-10-CM | POA: Diagnosis not present

## 2020-05-28 ENCOUNTER — Telehealth: Payer: Self-pay | Admitting: Physician Assistant

## 2020-05-28 NOTE — Telephone Encounter (Signed)
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 Ms. Mcsorley has been cld and scheduled to see Cassie on 2/1 at 1:30pm w/lab at 1pm. Pt aware to arrive 20 minutes early.

## 2020-06-01 ENCOUNTER — Other Ambulatory Visit: Payer: Self-pay | Admitting: Physician Assistant

## 2020-06-01 DIAGNOSIS — D751 Secondary polycythemia: Secondary | ICD-10-CM

## 2020-06-01 NOTE — Progress Notes (Signed)
[facility] [phone]   [phone]  CONSULT NOTE  REFERRING PHYSICIAN: Lance Bosch  REASON FOR CONSULTATION:  Polycythemia  HPI Alicia Moss is a 45 y.o. female  with a past medical history significant for depression, anxiety, dizziness/vestibular disorder, and tobacco abuse, is referred to the clinic for polycythemia  The patient states that she was having a routine visit with her PCP on 05/27/20 for evaluation for surgical clearance. She is hoping to have surgery performed in the near future for her herniated discs under the care of Dr. Noel Gerold from the [facility]. Her labs noted polycythemia with a hemoglobin of 16.5, hematocrit of 48. Her WBCs were 12.3. She had JAK-2 mutation testing which was reporedly negative. The patient was subsequently referred to the clinic today for further evaluation and management regarding the finding of polycythemia.They are wondering if she needs anti-coagulation due to her polycythemia per referral note.  Per chart review, the polycythemia is new. I have records from 2010-2016 which did not show evidence of polycythemia. The patientdenies any oral hormone use. She does have a mirena IUD which is scheduled to be removed on 06/19/20. She denies sleep apnea. She reports tobacco abuse but denies any diagnosis of COPD. She has been smoking about 30 years. She averages ~1.5 packs of cigarettes per day. She notes that she is trying to cut down but she is not ready to quit. She is prescribed Wellbutrin for smoking cessation. She denies systemic steroid use. Today, the patient is feeling fairly well without any concerning complaints. 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 She denies any  abnormal itching.Hedenies any abnormal bleeding or bruising including epistaxis, gingival bleeding, melena, hematochezia, hematemesis, or hematuria.The patient denies any recent fevers or unexplained weight loss.The patient denies heart disease.She denies lymphadenopathy or night sweats. She denies any recent dehydration,nausea/vomiting, or diarrhea. She takes a multivitamin twice a day.  The patient denies any known family history of any bone marrow disorders, blood disorders, or cancers.  She does not know her mother's medical history.  She states that her father is 93 and healthy.  The patient works in [employer], political [employer].  She is married and has 3 children.  She denies any alcohol or drug use.  As stated previously, the patient has been smoking for approximately 30 years averaging about 1/2 packs of cigarettes per day.  She states that 4 packs of cigarettes lasts her a week.  HPI  Past Medical History:  Diagnosis Date  . Anxiety   . Depression   . Dizziness     Past Surgical History:  Procedure Laterality Date  . slip disk Right   . SPINE SURGERY     L4-5, "shaved bone off nerve" per pt    Family History  Problem Relation Age of Onset  . Mental illness Sister   . Heart disease Brother     Social History Social History   Tobacco Use  . Smoking status: Current Every Day Smoker    Packs/day: 1.00    Types: Cigarettes  . Smokeless tobacco: Never Used  Substance Use Topics  . Alcohol use: No  . Drug use: No    No Known Allergies  Current Outpatient Medications  Medication Sig Dispense Refill  . buPROPion (WELLBUTRIN SR) 150 MG  12 hr tablet Take 150 mg by mouth 2 (two) times daily.    . pregabalin (LYRICA) 100 MG capsule Take 100 mg by mouth 2 (two) times daily.    . traMADol (ULTRAM) 50 MG tablet Take by mouth every 6 (six) hours as needed.    . venlafaxine XR (EFFEXOR-XR) 150 MG 24 hr capsule Take 150 mg by mouth daily.     No current facility-administered medications  for this visit.    REVIEW OF SYSTEMS:   Review of Systems  Constitutional: Positive for fatigue. negative for appetite change, chills, fever and unexpected weight change.  HENT: Negative for mouth sores, nosebleeds, sore throat and trouble swallowing.   Eyes: Negative for eye problems and icterus.  Respiratory: Negative for cough, hemoptysis, shortness of breath and wheezing.   Cardiovascular: Negative for chest pain and leg swelling.  Gastrointestinal: Negative for abdominal pain, constipation, diarrhea, nausea and vomiting.  Genitourinary: Negative for bladder incontinence, difficulty urinating, dysuria, frequency and hematuria.   Musculoskeletal: Negative for back pain, gait problem, neck pain and neck stiffness.  Skin: Negative for itching and rash.  Neurological: Positive for occasional dizziness.  Negative for extremity weakness, gait problem, headaches, light-headedness and seizures.  Hematological: Negative for adenopathy. Does not bruise/bleed easily.  Psychiatric/Behavioral: Negative for confusion, depression and sleep disturbance. The patient is not nervous/anxious.     PHYSICAL EXAMINATION:  Blood pressure (!) 150/80, pulse (!) 106, temperature 97.9 F (36.6 C), temperature source Tympanic, resp. rate 20, height 5' (1.524 m), weight 171 lb 6.4 oz (77.7 kg), SpO2 99 %.  ECOG PERFORMANCE STATUS: 1 - Symptomatic but completely ambulatory  Physical Exam  Constitutional: Oriented to person, place, and time and well-developed, well-nourished, and in no distress.  HENT:  Head: Normocephalic and atraumatic.  Mouth/Throat: Oropharynx is clear and moist. No oropharyngeal exudate.  Eyes: Conjunctivae are normal. Right eye exhibits no discharge. Left eye exhibits no discharge. No scleral icterus.  Neck: Normal range of motion. Neck supple.  Cardiovascular: Normal rate, regular rhythm, normal heart sounds and intact distal pulses.   Pulmonary/Chest: Effort normal and breath sounds  normal. No respiratory distress. No wheezes. No rales.  Abdominal: Soft. Bowel sounds are normal. Exhibits no distension and no mass. There is no tenderness.  Musculoskeletal: Normal range of motion. Exhibits no edema.  Lymphadenopathy:    No cervical adenopathy.  Neurological: Alert and oriented to person, place, and time. Exhibits normal muscle tone. Gait normal. Coordination normal.  Skin: Skin is warm and dry. No rash noted. Not diaphoretic. No erythema. No pallor.  Psychiatric: Mood, memory and judgment normal.  Vitals reviewed.  LABORATORY DATA: Lab Results  Component Value Date   WBC 11.2 (H) 06/02/2020   HGB 16.6 (H) 06/02/2020   HCT 48.7 (H) 06/02/2020   MCV 89.7 06/02/2020   PLT 291 06/02/2020      Chemistry      Component Value Date/Time   NA 141 06/02/2020 1251   K 4.1 06/02/2020 1251   CL 104 06/02/2020 1251   CO2 27 06/02/2020 1251   BUN 11 06/02/2020 1251   CREATININE 0.85 06/02/2020 1251   CREATININE 0.87 09/25/2015 1520      Component Value Date/Time   CALCIUM 9.6 06/02/2020 1251   ALKPHOS 69 06/02/2020 1251   AST 15 06/02/2020 1251   ALT 19 06/02/2020 1251   BILITOT 0.3 06/02/2020 1251       RADIOGRAPHIC STUDIES: No results found.  ASSESSMENT: This is a very pleasant 45 year old  Caucasian female referred to the clinic for polycythemia.    PLAN: The patient was seen with Dr. Arbutus Ped today. The patient had several lab studies performed including a CBC, CMP,iron studies, and ferritin. The patient CBC demonstrated persistent polycythemia with a hemoglobin of16.6.His hematocrit was elevated at 48.7%.Her WBCs were slightly elevated at 11.2. Her platelets were within normal limits at 291. The patient also had JAK2 mutation testing performed by her PCP to rule out polycythemia vera. This was reportedly negative. Her iron studies/ferritin are WNL.   Dr. Arbutus Ped believes that the patient's polycythemia is likelyreactive possiblysecondary to tobacco  use. Her tobacco use is likely also contributing to her slight leukocytosis.   Since the patient's JAK2 testing was negative for primary polycythemia vera, no specific management is needed since it is reactive/compensatory, except the patient was strongly encouraged to quit smoking. Since the patient may be undergoing surgery soon and there is concern for her surgical risk written on the referral. Dr. Arbutus Ped did offer the patient a one time therapeutic phlebotomy 1-2 weeks prior to surgery. An alternative is to donate 1 unit of blood for free at the [facility].   The patient does not know the exact date of her surgery. She will consider this option. She was given our number to call back if she would like [place] to arrange a 60 minute therapeutic phlebotomy appointment 1-2 weeks before her surgery date.   Dr. Arbutus Ped does not recommend the need for anti-coagulation in this patient; however, if the primary team or surgical team feels that she would benefit from starting primary prevention low dose baby aspirin after surgery based on her calculated risk of stroke/heart attack, that is a consideration in the future.   She was encouraged to decrease her dietary intake of iron rich food and was encouraged to reduce her multivitamin use to once a day, or to use a multivitamin without iron.   We will not arrange a follow up, but would be happy to see the patient on an as needed basis.   The patient voices understanding of current disease status and treatment options and is in agreement with the current care plan.  All questions were answered. The patient knows to call the clinic with any problems, questions or concerns. We can certainly see the patient much sooner if necessary.  Thank you so much for allowing me to participate in the care of Gerilynn D Yuan. I will continue to follow up the patient with you and assist in her care.  I spent 40-54 minutes in this encounter.   Disclaimer: This note  was dictated with voice recognition software. Similar sounding words can inadvertently be transcribed and may not be corrected upon review.   Annahi Short L Kaiyan Luczak June 02, 2020, 2:18 PM  ADDENDUM: Hematology/Oncology Attending: I had a face-to-face encounter with the patient today.  I reviewed her record and recommended her care plan.  This is a very pleasant 45 years old white female who was seen recently by her primary care provider for surgical clearance for back surgery.  During her evaluation the patient was found to have elevated hemoglobin and hematocrit on CBC. Her primary care provider ordered JAK2 mutation that was reported to be negative. The patient is a current smoker and she is also taking multivitamin with iron. I had a lengthy discussion with the patient today about her condition.  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 I recommended for the patient to proceed with her surgical resection with no concern about her elevated hemoglobin and hematocrit but if there is any concern from the surgery team, she could consider doing 1 unit of phlebotomy either at the cancer center or the [facility] before her procedure. I do not think the patient will need any anticoagulation because of her current polycythemia which is reactive in nature. I will release the patient to follow-up with her primary care physician from now on but will be happy to see her in the future if needed. The patient was advised to call if she has any concerning issues or questions.  Disclaimer: This note was dictated with voice recognition software. Similar sounding words can inadvertently be transcribed and may be missed upon review. Lajuana Matte, MD 06/02/20

## 2020-06-02 ENCOUNTER — Other Ambulatory Visit: Payer: Self-pay

## 2020-06-02 ENCOUNTER — Inpatient Hospital Stay: Payer: Managed Care, Other (non HMO) | Attending: Physician Assistant | Admitting: Physician Assistant

## 2020-06-02 ENCOUNTER — Encounter: Payer: Self-pay | Admitting: Physician Assistant

## 2020-06-02 ENCOUNTER — Inpatient Hospital Stay: Payer: Managed Care, Other (non HMO)

## 2020-06-02 ENCOUNTER — Other Ambulatory Visit: Payer: Self-pay | Admitting: Physician Assistant

## 2020-06-02 DIAGNOSIS — D751 Secondary polycythemia: Secondary | ICD-10-CM

## 2020-06-02 DIAGNOSIS — Z419 Encounter for procedure for purposes other than remedying health state, unspecified: Secondary | ICD-10-CM | POA: Diagnosis not present

## 2020-06-02 DIAGNOSIS — Z720 Tobacco use: Secondary | ICD-10-CM | POA: Diagnosis not present

## 2020-06-02 LAB — CBC WITH DIFFERENTIAL (CANCER CENTER ONLY)
Abs Immature Granulocytes: 0.05 10*3/uL (ref 0.00–0.07)
Basophils Absolute: 0.1 10*3/uL (ref 0.0–0.1)
Basophils Relative: 1 %
Eosinophils Absolute: 0.1 10*3/uL (ref 0.0–0.5)
Eosinophils Relative: 1 %
HCT: 48.7 % — ABNORMAL HIGH (ref 36.0–46.0)
Hemoglobin: 16.6 g/dL — ABNORMAL HIGH (ref 12.0–15.0)
Immature Granulocytes: 0 %
Lymphocytes Relative: 25 %
Lymphs Abs: 2.8 10*3/uL (ref 0.7–4.0)
MCH: 30.6 pg (ref 26.0–34.0)
MCHC: 34.1 g/dL (ref 30.0–36.0)
MCV: 89.7 fL (ref 80.0–100.0)
Monocytes Absolute: 0.5 10*3/uL (ref 0.1–1.0)
Monocytes Relative: 4 %
Neutro Abs: 7.6 10*3/uL (ref 1.7–7.7)
Neutrophils Relative %: 69 %
Platelet Count: 291 10*3/uL (ref 150–400)
RBC: 5.43 MIL/uL — ABNORMAL HIGH (ref 3.87–5.11)
RDW: 13 % (ref 11.5–15.5)
WBC Count: 11.2 10*3/uL — ABNORMAL HIGH (ref 4.0–10.5)
nRBC: 0 % (ref 0.0–0.2)

## 2020-06-02 LAB — CMP (CANCER CENTER ONLY)
ALT: 19 U/L (ref 0–44)
AST: 15 U/L (ref 15–41)
Albumin: 4.7 g/dL (ref 3.5–5.0)
Alkaline Phosphatase: 69 U/L (ref 38–126)
Anion gap: 10 (ref 5–15)
BUN: 11 mg/dL (ref 6–20)
CO2: 27 mmol/L (ref 22–32)
Calcium: 9.6 mg/dL (ref 8.9–10.3)
Chloride: 104 mmol/L (ref 98–111)
Creatinine: 0.85 mg/dL (ref 0.44–1.00)
GFR, Estimated: >60 mL/min (ref >60)
Glucose, Bld: 98 mg/dL (ref 70–99)
Potassium: 4.1 mmol/L (ref 3.5–5.1)
Sodium: 141 mmol/L (ref 135–145)
Total Bilirubin: 0.3 mg/dL (ref 0.3–1.2)
Total Protein: 8 g/dL (ref 6.5–8.1)

## 2020-06-02 LAB — IRON AND TIBC
Iron: 92 ug/dL (ref 41–142)
Saturation Ratios: 26 % (ref 21–57)
TIBC: 351 ug/dL (ref 236–444)
UIBC: 260 ug/dL (ref 120–384)

## 2020-06-02 LAB — FERRITIN: Ferritin: 91 ng/mL (ref 11–307)

## 2020-06-17 ENCOUNTER — Institutional Professional Consult (permissible substitution): Payer: Managed Care, Other (non HMO) | Admitting: Pulmonary Disease

## 2020-06-30 ENCOUNTER — Encounter: Payer: Self-pay | Admitting: Pulmonary Disease

## 2020-06-30 ENCOUNTER — Other Ambulatory Visit: Payer: Self-pay

## 2020-06-30 ENCOUNTER — Ambulatory Visit (INDEPENDENT_AMBULATORY_CARE_PROVIDER_SITE_OTHER): Payer: Managed Care, Other (non HMO) | Admitting: Pulmonary Disease

## 2020-06-30 VITALS — BP 116/84 | HR 81 | Temp 97.6°F | Ht 60.0 in | Wt 172.4 lb

## 2020-06-30 DIAGNOSIS — Z01811 Encounter for preprocedural respiratory examination: Secondary | ICD-10-CM

## 2020-06-30 DIAGNOSIS — Z720 Tobacco use: Secondary | ICD-10-CM

## 2020-06-30 DIAGNOSIS — Z419 Encounter for procedure for purposes other than remedying health state, unspecified: Secondary | ICD-10-CM | POA: Diagnosis not present

## 2020-06-30 DIAGNOSIS — D751 Secondary polycythemia: Secondary | ICD-10-CM

## 2020-06-30 NOTE — Patient Instructions (Addendum)
Nice to meet you!  No need for further testing.  You are low risk for pulmonary complication. I see no reason why surgery should not proceed.   Follow up as needed.

## 2020-06-30 NOTE — Progress Notes (Signed)
@Patient  ID: , female    DOB: [birth year], 45 y.o.   MRN: [id number]  Chief Complaint  Patient presents with  . Consult    Referred by PCP for surgical clearance. Back surgery with Dr. [id number]. States she has not had any pulmonary issues but was required to get cleared. Denied any oxygen usage as well as cpap usage.     Referring provider: Sharolyn Douglas, NP  HPI:   45 year old whom we are seeing at the request of 47, NP for preoperative evaluation.  Multiple handwritten notes from primary care provider sent with referral reviewed.  Patient is doing well.  She denies any dyspnea on exertion.  No shortness of breath.  No cough.  She denies any severe respiratory symptoms.  She is a current smoker.  Smoked for "as long as she can remember."  Successfully quit in the past with Chantix.  Unfortunate, it is no longer on the market.  She has tried Wellbutrin recently without success.  Reviewed spine surgeon notes via [facility] system.  Planning for lumbar surgery in the coming weeks.  Per patient will be a posterior approach, no abdominal incision.  She denies any recent URI symptoms.  Never needed prednisone for breathing.  No bronchitis or respiratory exacerbations per her report.  Her oxygen saturation is 96 or above today.  Discussed at length the nature of the surgery and her risk factors.  No identifiable modifiable risk factors noted.  Reviewed results of ARISCAT Model which yield low risk based on location of incision and assuming greater than 3 hours of surgery.  Reviewed most recent chest x-ray PA and lateral 04/20/2020 which on my interpretation reveals clear lungs without infiltrate, effusion, pneumothorax.  PMH: Tobacco abuse, back pain Surgical history: Prior back surgery about 8 years prior Family history: Reviewed, denies significant respiratory issues in first-degree relatives Social history: Current smoker half pack a day for 20+ years, works in  04/22/2020 as [employer], married, autistic son at home which she devotes a lot of time to care for  Investment banker, corporate / Pulmonary Flowsheets:   ACT:  No flowsheet data found.  MMRC: No flowsheet data found.  Epworth:  No flowsheet data found.  Tests:   FENO:  No results found for: NITRICOXIDE  PFT: No flowsheet data found.  WALK:  No flowsheet data found.  Imaging: Personally reviewed and as per EMR discussion this note  Lab Results: Personally reviewed, mild polycythemia, eosinophils 100 not significantly elevated CBC    Component Value Date/Time   WBC 11.2 (H) 06/02/2020 1251   WBC 11.4 (H) 10/13/2014 2131   RBC 5.43 (H) 06/02/2020 1251   HGB 16.6 (H) 06/02/2020 1251   HCT 48.7 (H) 06/02/2020 1251   PLT 291 06/02/2020 1251   MCV 89.7 06/02/2020 1251   MCV 88.1 09/25/2015 1542   MCH 30.6 06/02/2020 1251   MCHC 34.1 06/02/2020 1251   RDW 13.0 06/02/2020 1251   LYMPHSABS 2.8 06/02/2020 1251   MONOABS 0.5 06/02/2020 1251   EOSABS 0.1 06/02/2020 1251   BASOSABS 0.1 06/02/2020 1251    BMET    Component Value Date/Time   NA 141 06/02/2020 1251   K 4.1 06/02/2020 1251   CL 104 06/02/2020 1251   CO2 27 06/02/2020 1251   GLUCOSE 98 06/02/2020 1251   BUN 11 06/02/2020 1251   CREATININE 0.85 06/02/2020 1251   CREATININE 0.87 09/25/2015 1520   CALCIUM 9.6 06/02/2020 1251   GFRNONAA >60 06/02/2020 1251  GFRAA >60 10/13/2014 2131    BNP No results found for: BNP  ProBNP No results found for: PROBNP  Specialty Problems   None     No Known Allergies  Immunization History  Administered Date(s) Administered  . Influenza,inj,Quad PF,6+ Mos 01/27/2016  . PFIZER(Purple Top)SARS-COV-2 Vaccination 07/19/2019    Past Medical History:  Diagnosis Date  . Anxiety   . Depression   . Dizziness     Tobacco History: Social History   Tobacco Use  Smoking Status Current Every Day Smoker  . Packs/day: 1.00  . Types: Cigarettes  Smokeless Tobacco  Never Used   Ready to quit: Not Answered Counseling given: Not Answered   Continue to not smoke  Outpatient Encounter Medications as of 06/30/2020  Medication Sig  . pregabalin (LYRICA) 100 MG capsule Take 100 mg by mouth 2 (two) times daily.  Marland Kitchen venlafaxine XR (EFFEXOR-XR) 150 MG 24 hr capsule Take 150 mg by mouth daily.  . [DISCONTINUED] buPROPion (WELLBUTRIN SR) 150 MG 12 hr tablet Take 150 mg by mouth 2 (two) times daily.  . [DISCONTINUED] traMADol (ULTRAM) 50 MG tablet Take by mouth every 6 (six) hours as needed.   No facility-administered encounter medications on file as of 06/30/2020.     Review of Systems  Review of Systems  No dyspnea exertion.  No orthopnea or PND.  Comprehensive review of systems otherwise negative. Physical Exam  BP 116/84   Pulse 81   Temp 97.6 F (36.4 C) (Temporal)   Ht 5' (1.524 m)   Wt 172 lb 6.4 oz (78.2 kg)   SpO2 96% Comment: on RA  BMI 33.67 kg/m   Wt Readings from Last 5 Encounters:  06/30/20 172 lb 6.4 oz (78.2 kg)  06/02/20 171 lb 6.4 oz (77.7 kg)  01/27/16 145 lb (65.8 kg)  11/26/15 141 lb 6.4 oz (64.1 kg)  10/15/15 140 lb 6.4 oz (63.7 kg)    BMI Readings from Last 5 Encounters:  06/30/20 33.67 kg/m  06/02/20 33.47 kg/m  01/27/16 28.32 kg/m  11/26/15 27.62 kg/m  10/15/15 27.42 kg/m     Physical Exam General: Well-appearing, sitting in exam chair Eyes: EOMI, no icterus Neck: Supple, no JVD appreciated Cardiovascular: Warm, no edema Pulmonary: Clear to auscultation bilaterally, no wheeze or crackle Abdomen: Nondistended, bowel sounds present MSK: No synovitis, joint effusion Neuro: No weakness, normal gait Psych: Normal mood, full affect   Assessment & Plan:   Pre-operative evaluation: Risk assessment calculation via ARISCAT yields low risk (1.3%) of post-operative complication. No modifiable risk factors.  Tobacco abuse: Counseled to quit smoking. Pre-contemplative. PCP to continue to address.    Return if  symptoms worsen or fail to improve.   Karren Burly, MD 06/30/2020

## 2020-07-31 DIAGNOSIS — Z419 Encounter for procedure for purposes other than remedying health state, unspecified: Secondary | ICD-10-CM | POA: Diagnosis not present

## 2020-08-03 ENCOUNTER — Encounter: Payer: Self-pay | Admitting: Family Medicine

## 2020-08-30 DIAGNOSIS — Z419 Encounter for procedure for purposes other than remedying health state, unspecified: Secondary | ICD-10-CM | POA: Diagnosis not present

## 2020-09-23 ENCOUNTER — Other Ambulatory Visit: Payer: Self-pay

## 2020-09-24 ENCOUNTER — Encounter: Payer: Self-pay | Admitting: Family Medicine

## 2020-09-24 ENCOUNTER — Ambulatory Visit (INDEPENDENT_AMBULATORY_CARE_PROVIDER_SITE_OTHER): Payer: Managed Care, Other (non HMO) | Admitting: Family Medicine

## 2020-09-24 VITALS — BP 110/84 | HR 90 | Temp 97.5°F | Ht 60.0 in | Wt 175.2 lb

## 2020-09-24 DIAGNOSIS — M545 Low back pain, unspecified: Secondary | ICD-10-CM | POA: Diagnosis not present

## 2020-09-24 DIAGNOSIS — F419 Anxiety disorder, unspecified: Secondary | ICD-10-CM

## 2020-09-24 DIAGNOSIS — G8929 Other chronic pain: Secondary | ICD-10-CM

## 2020-09-24 DIAGNOSIS — F339 Major depressive disorder, recurrent, unspecified: Secondary | ICD-10-CM

## 2020-09-24 NOTE — Progress Notes (Signed)
Chief Complaint  Patient presents with  . Establish Care    Np here to discuss anxiety and depression.      HPI: Alicia Moss is a 45 y.o. female here to establish care with our office. 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  She has a h/o anxiety and depression for which she takes effexor 150mg  daily. She has been on med x 4-5 years. She also takes lyrica 100mg  qHS - h/o lumbar spine surgery. She has upcoming lumbar spine surgery with Dr. . She had pre-op labs done in 04/2020. EKG, CXR - normal in 04/2020 She had sleep study in early 2022.   Depression screen [facility] 2/9 09/24/2020 10/15/2015 10/15/2015  Decreased Interest 2 0 0  Down, Depressed, Hopeless 0 0 0  PHQ - 2 Score 2 0 0  Altered sleeping 1 - -  Tired, decreased energy 1 - -  Change in appetite 0 - -  Feeling bad or failure about yourself  0 - -  Trouble concentrating 0 - -  Moving slowly or fidgety/restless 0 - -  Suicidal thoughts 0 - -  PHQ-9 Score 4 - -  Difficult doing work/chores Not difficult at all - -    GAD 7 : Generalized Anxiety Score 09/24/2020  Nervous, Anxious, on Edge 0  Control/stop worrying 0  Worry too much - different things 0  Trouble relaxing 0  Restless 0  Easily annoyed or irritable 1  Afraid - awful might happen 0  Total GAD 7 Score 1  Anxiety Difficulty Not difficult at all     Past Medical History:  Diagnosis Date  . Anxiety   . Depression   . Dizziness     Past Surgical History:  Procedure Laterality Date  . slip disk Right   . SPINE SURGERY     L4-5, "shaved bone off nerve" per pt    Social History   Socioeconomic History  . Marital status: Divorced    Spouse name: Not on file  . Number of children: 3  . Years of education: 41  . Highest education level: Not on file  Occupational History  . Occupation: [employer]  Tobacco Use  . Smoking status: Current Every Day Smoker    Packs/day: 1.00    Types: Cigarettes  . Smokeless tobacco: Never  Used  Substance and Sexual Activity  . Alcohol use: No  . Drug use: No  . Sexual activity: Never  Other Topics Concern  . Not on file  Social History Narrative   Lives w/ friend and son   Caffeine use: 1-2 cups per day of coffee/tea   No soda   Social Determinants of Health   Financial Resource Strain: Not on file  Food Insecurity: Not on file  Transportation Needs: Not on file  Physical Activity: Not on file  Stress: Not on file  Social Connections: Not on file  Intimate Partner Violence: Not on file    Family History  Problem Relation Age of Onset  . Mental illness Sister   . Heart disease Brother      Immunization History  Administered Date(s) Administered  . Influenza,inj,Quad PF,6+ Mos 01/27/2016  . PFIZER Comirnaty(Gray Top)Covid-19 Tri-Sucrose Vaccine 07/19/2019  . PFIZER(Purple Top)SARS-COV-2 Vaccination 07/19/2019, 03/01/2020    Outpatient Encounter Medications as of 09/24/2020  Medication Sig  . levonorgestrel (MIRENA) 20 MCG/DAY IUD 1 each by Intrauterine route once.  . Multiple Vitamin (MULTIVITAMIN) capsule Take by mouth.  . pregabalin (LYRICA) 100  MG capsule Take 100 mg by mouth 2 (two) times daily.  Marland Kitchen venlafaxine XR (EFFEXOR-XR) 150 MG 24 hr capsule Take 150 mg by mouth daily.   No facility-administered encounter medications on file as of 09/24/2020.     ROS: Pertinent positives and negatives noted in HPI. Remainder of ROS non-contributory   No Known Allergies  BP 110/84 (BP Location: Left Arm, Patient Position: Sitting, Cuff Size: Normal)   Pulse 90   Temp (!) 97.5 F (36.4 C) (Temporal)   Ht 5' (1.524 m)   Wt 175 lb 3.2 oz (79.5 kg)   SpO2 99%   BMI 34.22 kg/m   Wt Readings from Last 3 Encounters:  09/24/20 175 lb 3.2 oz (79.5 kg)  06/30/20 172 lb 6.4 oz (78.2 kg)  06/02/20 171 lb 6.4 oz (77.7 kg)   Temp Readings from Last 3 Encounters:  09/24/20 (!) 97.5 F (36.4 C) (Temporal)  06/30/20 97.6 F (36.4 C) (Temporal)  06/02/20 97.9  F (36.6 C) (Tympanic)   BP Readings from Last 3 Encounters:  09/24/20 110/84  06/30/20 116/84  06/02/20 (!) 150/80   Pulse Readings from Last 3 Encounters:  09/24/20 90  06/30/20 81  06/02/20 (!) 106     Physical Exam Constitutional:      General: She is not in acute distress.    Appearance: Normal appearance. She is not ill-appearing.  Pulmonary:     Effort: No respiratory distress.  Neurological:     Mental Status: She is alert and oriented to person, place, and time.  Psychiatric:        Mood and Affect: Mood normal.        Behavior: Behavior normal.     A/P:  1. Anxiety 2. Depression, recurrent (HCC) - controlled, at baseline - cont effexor 150mg  daily  3. Chronic midline low back pain without sciatica - take lyrica 100mg  qHS - has surgery scheduled in 10/2020    This visit occurred during the SARS-CoV-2 public health emergency.  Safety protocols were in place, including screening questions prior to the visit, additional usage of staff PPE, and extensive cleaning of exam room while observing appropriate contact time as indicated for disinfecting solutions.

## 2020-09-30 ENCOUNTER — Encounter: Payer: Self-pay | Admitting: Physician Assistant

## 2020-09-30 DIAGNOSIS — Z419 Encounter for procedure for purposes other than remedying health state, unspecified: Secondary | ICD-10-CM | POA: Diagnosis not present

## 2020-10-02 ENCOUNTER — Encounter: Payer: Self-pay | Admitting: Family Medicine

## 2020-10-22 MED ORDER — VENLAFAXINE HCL ER 150 MG PO CP24: 150.0000 mg | ORAL_CAPSULE | Freq: Every day | ORAL | 0 refills | Status: DC

## 2020-10-22 NOTE — Telephone Encounter (Signed)
Last fill 04/14/20  Historical Provider Last OV 09/24/20

## 2020-10-30 DIAGNOSIS — Z419 Encounter for procedure for purposes other than remedying health state, unspecified: Secondary | ICD-10-CM | POA: Diagnosis not present

## 2020-11-02 ENCOUNTER — Encounter: Payer: Self-pay | Admitting: Family Medicine

## 2020-11-10 ENCOUNTER — Encounter: Payer: Self-pay | Admitting: Family Medicine

## 2020-11-17 ENCOUNTER — Other Ambulatory Visit: Payer: Self-pay | Admitting: Family Medicine

## 2020-11-17 DIAGNOSIS — F339 Major depressive disorder, recurrent, unspecified: Secondary | ICD-10-CM

## 2020-11-20 ENCOUNTER — Encounter: Payer: Self-pay | Admitting: Family Medicine

## 2020-11-30 DIAGNOSIS — Z419 Encounter for procedure for purposes other than remedying health state, unspecified: Secondary | ICD-10-CM | POA: Diagnosis not present

## 2020-12-16 ENCOUNTER — Other Ambulatory Visit: Payer: Self-pay

## 2020-12-17 ENCOUNTER — Ambulatory Visit (INDEPENDENT_AMBULATORY_CARE_PROVIDER_SITE_OTHER): Payer: Managed Care, Other (non HMO) | Admitting: Family Medicine

## 2020-12-17 ENCOUNTER — Encounter: Payer: Self-pay | Admitting: Family Medicine

## 2020-12-17 VITALS — BP 108/50 | HR 89 | Temp 97.7°F | Ht 60.0 in | Wt 174.6 lb

## 2020-12-17 DIAGNOSIS — R519 Headache, unspecified: Secondary | ICD-10-CM

## 2020-12-17 DIAGNOSIS — G8929 Other chronic pain: Secondary | ICD-10-CM

## 2020-12-17 DIAGNOSIS — U099 Post covid-19 condition, unspecified: Secondary | ICD-10-CM | POA: Diagnosis not present

## 2020-12-17 MED ORDER — BUTALBITAL-APAP-CAFFEINE 50-325-40 MG PO TABS: 1.0000 | ORAL_TABLET | Freq: Every day | ORAL | 0 refills | Status: DC | PRN

## 2020-12-17 NOTE — Progress Notes (Signed)
[facility] [address] [place] [address] Dept: [phone] Dept Fax: [phone]  Office Visit  Subjective:    Patient ID: Alicia Moss, female    DOB: 1975/12/26, 45 y.o..   MRN: [id number]  Chief Complaint  Patient presents with   Follow-up    2 wk f/u. Pt c/o lingering headache from having covid positive.    History of Present Illness:  Patient is in today for assessment of lingering headache after a COVID infection. Alicia Moss tested positive for COVID on Aug. 3rd, after several days of flu-like symptoms. One of the symptoms she developed with COVID was a headache. Although the other symptoms have mostly resolved at this point, she continues to have daily, persistent headaches. She did not have a headache issue prior to COVID. She has not noted other neurological symptoms associated with this. She has been taking up to four 500 mg Tylenol 3-4 times a day, without relief. She did have an old prescription for a headache medication she was prescribed briefly for a headache some years ago. This did help the one time she took it, at least allowing her to get some sleep. However, she does not know the name of the medication.  Past Medical History: Patient Active Problem List   Diagnosis Date Noted   Polycythemia, secondary 06/02/2020   Tobacco use 06/02/2020   Dizziness and giddiness 10/15/2015   Past Surgical History:  Procedure Laterality Date   slip disk Right    SPINE SURGERY     L4-5, "shaved bone off nerve" per pt   Family History  Problem Relation Age of Onset   Mental illness Sister    Heart disease Brother    Outpatient Medications Prior to Visit  Medication Sig Dispense Refill   levonorgestrel (MIRENA) 20 MCG/DAY IUD 1 each by Intrauterine route once.     Multiple Vitamin (MULTIVITAMIN) capsule Take by mouth.     pregabalin (LYRICA) 100 MG capsule Take 100 mg by mouth 2 (two) times daily.      venlafaxine XR (EFFEXOR-XR) 150 MG 24 hr capsule TAKE ONE CAPSULE BY MOUTH DAILY 30 capsule 0   No facility-administered medications prior to visit.   No Known Allergies    Objective:   Today's Vitals   12/17/20 0808  BP: (!) 108/50  Pulse: 89  Temp: 97.7 F (36.5 C)  TempSrc: Temporal  SpO2: 97%  Weight: 174 lb 9.6 oz (79.2 kg)  Height: 5' (1.524 m)   Body mass index is 34.1 kg/m.   General: Well developed, well nourished. No acute distress. HEENT: Normocephalic, non-traumatic. PERRL, EOMI. Conjunctiva clear. Fundiscopic exam shows normal disc   and vasculature. External ears normal. EAC and TMs normal bilaterally. Nose    clear without congestion or rhinorrhea. Mucous membranes moist. Oropharynx clear. Good dentition. Neck: Supple. No lymphadenopathy. No thyromegaly. Lungs: Clear to auscultation bilaterally. No wheezing, rales or rhonchi. Neuro: No focal neurological deficits. Psych: Alert and oriented. Normal mood and affect.  Health Maintenance Due  Topic Date Due   Pneumococcal Vaccine 3-60 Years old (1 - PCV) Never done   HIV Screening  Never done   Hepatitis C Screening  Never done   TETANUS/TDAP  Never done   PAP SMEAR-Modifier  01/27/2019   COLONOSCOPY (Pts 45-7yrs Insurance coverage will need to be confirmed)  Never done   INFLUENZA VACCINE  11/30/2020      Assessment & Plan:   1. 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 The headaches do appear to be such. It is uncertain how long these may last. I recommend Ms. Cassaro work to get adequate rest/sleep, eat regularly, hydrate well, and try to get some daily physical activity. I will provide a short-course of Fioricet to use once a day for headache (primarily in the evening). I will have her follow-up in 2 weeks if not improved.  - butalbital-acetaminophen-caffeine (FIORICET) 50-325-40 MG tablet; Take 1 tablet by mouth daily as  needed for headache.  Dispense: 14 tablet; Refill: 0  Loyola Mast, MD

## 2020-12-21 ENCOUNTER — Encounter: Payer: Self-pay | Admitting: Family Medicine

## 2020-12-21 DIAGNOSIS — R519 Headache, unspecified: Secondary | ICD-10-CM

## 2020-12-21 DIAGNOSIS — G8929 Other chronic pain: Secondary | ICD-10-CM

## 2020-12-21 MED ORDER — KETOROLAC TROMETHAMINE 10 MG PO TABS: 10.0000 mg | ORAL_TABLET | Freq: Four times a day (QID) | ORAL | 0 refills | Status: DC | PRN

## 2020-12-28 ENCOUNTER — Other Ambulatory Visit: Payer: Self-pay

## 2020-12-28 DIAGNOSIS — F339 Major depressive disorder, recurrent, unspecified: Secondary | ICD-10-CM

## 2020-12-28 MED ORDER — VENLAFAXINE HCL ER 150 MG PO CP24: 150.0000 mg | ORAL_CAPSULE | Freq: Every day | ORAL | 0 refills | Status: DC

## 2020-12-28 NOTE — Telephone Encounter (Signed)
Refill request for: Venlafaxine HCL 150 mg LR 11/17/20, #30, 0 rf LOV  09/24/20 FOV  none scheduled.  Please review and advise.  Thanks. Dm/cma

## 2020-12-31 DIAGNOSIS — Z419 Encounter for procedure for purposes other than remedying health state, unspecified: Secondary | ICD-10-CM | POA: Diagnosis not present

## 2021-01-13 ENCOUNTER — Encounter: Payer: Managed Care, Other (non HMO) | Admitting: Family Medicine

## 2021-01-25 ENCOUNTER — Other Ambulatory Visit: Payer: Self-pay | Admitting: Family

## 2021-01-25 DIAGNOSIS — F339 Major depressive disorder, recurrent, unspecified: Secondary | ICD-10-CM

## 2021-01-29 ENCOUNTER — Encounter: Payer: Managed Care, Other (non HMO) | Admitting: Family Medicine

## 2021-01-30 DIAGNOSIS — Z419 Encounter for procedure for purposes other than remedying health state, unspecified: Secondary | ICD-10-CM | POA: Diagnosis not present

## 2021-02-01 ENCOUNTER — Other Ambulatory Visit: Payer: Self-pay

## 2021-02-01 DIAGNOSIS — F339 Major depressive disorder, recurrent, unspecified: Secondary | ICD-10-CM

## 2021-02-01 MED ORDER — VENLAFAXINE HCL ER 150 MG PO CP24: 150.0000 mg | ORAL_CAPSULE | Freq: Every day | ORAL | 0 refills | Status: DC

## 2021-02-01 NOTE — Telephone Encounter (Signed)
Refill request for:  Venlafaxine HCL ER 150 mg LR  12/27/20, #30, 0 rf LOV 09/24/20 FOV   none scheduled.     Please review advise.   Thanks.  Dm/cma

## 2021-03-02 DIAGNOSIS — Z419 Encounter for procedure for purposes other than remedying health state, unspecified: Secondary | ICD-10-CM | POA: Diagnosis not present

## 2021-03-12 ENCOUNTER — Other Ambulatory Visit: Payer: Self-pay | Admitting: Family

## 2021-03-12 DIAGNOSIS — F339 Major depressive disorder, recurrent, unspecified: Secondary | ICD-10-CM

## 2021-04-01 DIAGNOSIS — Z419 Encounter for procedure for purposes other than remedying health state, unspecified: Secondary | ICD-10-CM | POA: Diagnosis not present

## 2021-04-11 ENCOUNTER — Other Ambulatory Visit: Payer: Self-pay | Admitting: Family

## 2021-04-11 DIAGNOSIS — F339 Major depressive disorder, recurrent, unspecified: Secondary | ICD-10-CM

## 2021-05-02 DIAGNOSIS — Z419 Encounter for procedure for purposes other than remedying health state, unspecified: Secondary | ICD-10-CM | POA: Diagnosis not present

## 2021-05-14 ENCOUNTER — Other Ambulatory Visit: Payer: Self-pay | Admitting: Family

## 2021-05-14 DIAGNOSIS — F339 Major depressive disorder, recurrent, unspecified: Secondary | ICD-10-CM

## 2021-06-02 DIAGNOSIS — Z419 Encounter for procedure for purposes other than remedying health state, unspecified: Secondary | ICD-10-CM | POA: Diagnosis not present

## 2021-06-17 ENCOUNTER — Ambulatory Visit
Admission: EM | Admit: 2021-06-17 | Discharge: 2021-06-17 | Disposition: A | Discharge status: Home / Self Care | Payer: Managed Care, Other (non HMO) | Attending: Internal Medicine | Admitting: Internal Medicine

## 2021-06-17 ENCOUNTER — Other Ambulatory Visit: Payer: Self-pay | Admitting: Family

## 2021-06-17 DIAGNOSIS — R42 Dizziness and giddiness: Secondary | ICD-10-CM | POA: Diagnosis not present

## 2021-06-17 DIAGNOSIS — H527 Unspecified disorder of refraction: Secondary | ICD-10-CM

## 2021-06-17 DIAGNOSIS — F339 Major depressive disorder, recurrent, unspecified: Secondary | ICD-10-CM

## 2021-06-17 NOTE — ED Triage Notes (Addendum)
Pt states she has had chest pain (middle of chest with no radiation, tightness), blurry vision with dizziness that started two days ago, she denies having active chest pain at this time.  Patient is ambulatory and verbal (A&O x4) and denies SOB.

## 2021-06-17 NOTE — Discharge Instructions (Signed)
Please wear your glasses all the time to help with your vision If you continue to see floaters or if you start seeing colored floaters, please go to an ophthalmologist for funduscopy.

## 2021-06-17 NOTE — ED Provider Notes (Signed)
[facility]    CSN: [id number] Arrival date & time: 06/17/21  1404      History   Chief Complaint Chief Complaint  Patient presents with   Dizziness    HPI Alicia Moss is a 46 y.o. female comes to the urgent care with a 2-day history of blurry vision with dizziness.  Patient endorses precordial chest discomfort which is currently resolved.  No aggravating or relieving factors for the chest discomfort.  Patient describes blurry vision as persistent with noncolored floaters in her visual field.  She denies any history of headaches.  She smokes a pack of cigarettes a day and has done that for several years.  No nausea, vomiting or diarrhea.  No difficulty finding words.  No extremity weakness.  Patient has prescription glasses but wears them only when she is driving.  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 Authorizing Provider  butalbital-acetaminophen-caffeine (FIORICET) [phone] MG tablet Take 1 tablet by mouth daily as needed for headache. 12/17/20   Loyola Mast, MD  ketorolac (TORADOL) 10 MG tablet Take 1 tablet (10 mg total) by mouth every 6 (six) hours as needed. 12/21/20   Loyola Mast, MD  levonorgestrel (MIRENA) 20 MCG/DAY IUD 1 each by Intrauterine route once.    [provider]  Multiple Vitamin (MULTIVITAMIN) capsule Take by  mouth.    [provider]  pregabalin (LYRICA) 100 MG capsule Take 100 mg by mouth 2 (two) times daily. 03/29/20   [provider]  venlafaxine XR (EFFEXOR-XR) 150 MG 24 hr capsule TAKE ONE CAPSULE BY MOUTH DAILY 05/14/21   Eulis Foster, FNP    Family History Family History  Problem Relation Age of Onset   Mental illness Sister    Heart disease Brother     Social History Social History   Tobacco Use   Smoking status: Every Day    Packs/day: 1.00    Types: Cigarettes   Smokeless tobacco: Never  Substance Use Topics   Alcohol use: No   Drug use: No     Allergies   Patient has no known allergies.   Review of Systems Review of Systems  Eyes:  Positive for visual disturbance.  Skin: Negative.   Neurological:  Positive for dizziness. Negative for headaches.  Psychiatric/Behavioral: Negative.  Negative for confusion and decreased concentration.     Physical Exam Triage Vital Signs ED Triage Vitals  Enc Vitals Group     BP  06/17/21 1440 128/83     Pulse Rate 06/17/21 1440 87     Resp 06/17/21 1440 18     Temp 06/17/21 1440 98.4 F (36.9 C)     Temp Source 06/17/21 1440 Oral     SpO2 06/17/21 1440 94 %     Weight --      Height --      Head Circumference --      Peak Flow --      Pain Score 06/17/21 1439 0     Pain Loc --      Pain Edu? --      Excl. in [place]? --    No data found.  Updated Vital Signs BP 128/83 (BP Location: Right Arm)    Pulse 87    Temp 98.4 F (36.9 C) (Oral)    Resp 18    LMP  (LMP Unknown)    SpO2 94%   Visual Acuity Right Eye Distance: 20/100 Left Eye Distance: 20/70 Bilateral Distance: 20/50  Right Eye Near:   Left Eye Near:    Bilateral Near:     Physical Exam Vitals and nursing note reviewed.  Constitutional:      General: She is not in acute distress.    Appearance: Normal appearance. She is not ill-appearing.  HENT:     Right Ear: Tympanic membrane normal.     Left Ear: Tympanic membrane normal.   Cardiovascular:     Rate and Rhythm: Normal rate and regular rhythm.     Pulses: Normal pulses.     Heart sounds: Normal heart sounds.  Pulmonary:     Effort: Pulmonary effort is normal.     Breath sounds: Normal breath sounds. No stridor. No wheezing.  Abdominal:     General: Bowel sounds are normal.     Palpations: Abdomen is soft.  Musculoskeletal:        General: Normal range of motion.  Neurological:     General: No focal deficit present.     Mental Status: She is alert and oriented to person, place, and time.     Cranial Nerves: No cranial nerve deficit.     Sensory: No sensory deficit.     Motor: No weakness.     Coordination: Coordination normal.     UC Treatments / Results  Labs (all labs ordered are listed, but only abnormal results are displayed) Labs Reviewed - No data to display  EKG   Radiology No results found.  Procedures Procedures (including critical care time)  Medications Ordered in UC Medications - No data to display  Initial Impression / Assessment and Plan / UC Course  I have reviewed the triage vital signs and the nursing notes.  Pertinent labs & imaging results that were available during my care of the patient were reviewed by me and considered in my medical decision making (see chart for details).     1.  Refractive disorder of both eyes: Visual acuity shows a right eye acuity of 20/100 and left with acuity of 20/100 as well Patient is encouraged to wear her glasses regularly If she started to experience worsening floaters in the visual field or start seeing colored floaters, she is advised to go to the ophthalmologist for funduscopy. Final Clinical Impressions(s) / UC Diagnoses   Final diagnoses:  Eye refraction disorder     Discharge Instructions      Please wear your glasses all the time to help with your vision If you continue to see  floaters or if you start seeing colored floaters, please go to an ophthalmologist for  funduscopy.     ED Prescriptions   None    PDMP not reviewed this encounter.   Chase Picket, MD 06/18/21 [phone]

## 2021-06-24 ENCOUNTER — Encounter: Payer: Self-pay | Admitting: Family Medicine

## 2021-06-28 ENCOUNTER — Encounter: Payer: Self-pay | Admitting: Nurse Practitioner

## 2021-06-28 ENCOUNTER — Other Ambulatory Visit: Payer: Self-pay

## 2021-06-28 ENCOUNTER — Ambulatory Visit (INDEPENDENT_AMBULATORY_CARE_PROVIDER_SITE_OTHER): Payer: Managed Care, Other (non HMO) | Admitting: Nurse Practitioner

## 2021-06-28 VITALS — BP 130/84 | HR 83 | Temp 97.7°F | Wt 178.0 lb

## 2021-06-28 DIAGNOSIS — H8113 Benign paroxysmal vertigo, bilateral: Secondary | ICD-10-CM | POA: Diagnosis not present

## 2021-06-28 DIAGNOSIS — F419 Anxiety disorder, unspecified: Secondary | ICD-10-CM | POA: Insufficient documentation

## 2021-06-28 DIAGNOSIS — F339 Major depressive disorder, recurrent, unspecified: Secondary | ICD-10-CM | POA: Diagnosis not present

## 2021-06-28 DIAGNOSIS — F32A Anxiety disorder, unspecified: Secondary | ICD-10-CM | POA: Insufficient documentation

## 2021-06-28 MED ORDER — VENLAFAXINE HCL ER 225 MG PO TB24: 225.0000 mg | ORAL_TABLET | Freq: Every day | ORAL | 0 refills | Status: DC

## 2021-06-28 MED ORDER — MECLIZINE HCL 25 MG PO TABS: 25.0000 mg | ORAL_TABLET | Freq: Three times a day (TID) | ORAL | 0 refills | Status: DC | PRN

## 2021-06-28 NOTE — Progress Notes (Deleted)
Established Patient Office Visit  Subjective:  Patient ID: Alicia Moss, female    DOB: Sep 26, 1975  Age: 46 y.o. MRN: [id number]  CC: No chief complaint on file.   HPI Alicia Moss presents to establish care with a new provider.  Introduced to Publishing rights manager role and practice setting.  All questions answered.  Discussed provider/patient relationship and expectations. She is here today to follow up on depression and anxiety.     Past Medical History:  Diagnosis Date   Anxiety    Depression    Dizziness     Past Surgical History:  Procedure Laterality Date   slip disk Right    SPINE SURGERY     L4-5, "shaved bone off nerve" per pt    Family History  Problem Relation Age of Onset   Mental illness Sister    Heart disease Brother     Social History   Socioeconomic History   Marital status: Divorced    Spouse name: Not on file   Number of children: 3   Years of education: 14   Highest education level: Not on file  Occupational History   Occupation: [employer]  Tobacco Use   Smoking status: Every Day    Packs/day: 1.00    Types: Cigarettes   Smokeless tobacco: Never  Substance and Sexual Activity   Alcohol use: No   Drug use: No   Sexual activity: Never  Other Topics Concern   Not on file  Social History Narrative   Lives w/ friend and son   Caffeine use: 1-2 cups per day of coffee/tea   No soda   Social Determinants of Corporate investment banker Strain: Not on file  Food Insecurity: Not on file  Transportation Needs: Not on file  Physical Activity: Not on file  Stress: Not on file  Social Connections: Not on file  Intimate Partner Violence: Not on file    Outpatient Medications Prior to Visit  Medication Sig Dispense Refill   butalbital-acetaminophen-caffeine (FIORICET) 50-325-40 MG tablet Take 1 tablet by mouth daily as needed for headache. 14 tablet 0   ketorolac (TORADOL) 10 MG tablet Take 1 tablet (10 mg total) by mouth every 6  (six) hours as needed. 20 tablet 0   levonorgestrel (MIRENA) 20 MCG/DAY IUD 1 each by Intrauterine route once.     Multiple Vitamin (MULTIVITAMIN) capsule Take by mouth.     pregabalin (LYRICA) 100 MG capsule Take 100 mg by mouth 2 (two) times daily.     venlafaxine XR (EFFEXOR-XR) 150 MG 24 hr capsule TAKE ONE CAPSULE BY MOUTH DAILY 30 capsule 0   No facility-administered medications prior to visit.    No Known Allergies  ROS Review of Systems    Objective:    Physical Exam  LMP  (LMP Unknown)  Wt Readings from Last 3 Encounters:  12/17/20 174 lb 9.6 oz (79.2 kg)  09/24/20 175 lb 3.2 oz (79.5 kg)  06/30/20 172 lb 6.4 oz (78.2 kg)     Health Maintenance Due  Topic Date Due   HIV Screening  Never done   Hepatitis C Screening  Never done   TETANUS/TDAP  Never done   PAP SMEAR-Modifier  01/27/2019   COLONOSCOPY (Pts 45-51yrs Insurance coverage will need to be confirmed)  Never done   COVID-19 Vaccine (5 - Booster) 04/26/2020   INFLUENZA VACCINE  11/30/2020    There are no preventive care reminders to display for this patient.  Lab Results  Component  Value Date   TSH 1.54 01/27/2016   Lab Results  Component Value Date   WBC 11.2 (H) 06/02/2020   HGB 16.6 (H) 06/02/2020   HCT 48.7 (H) 06/02/2020   MCV 89.7 06/02/2020   PLT 291 06/02/2020   Lab Results  Component Value Date   NA 141 06/02/2020   K 4.1 06/02/2020   CO2 27 06/02/2020   GLUCOSE 98 06/02/2020   BUN 11 06/02/2020   CREATININE 0.85 06/02/2020   BILITOT 0.3 06/02/2020   ALKPHOS 69 06/02/2020   AST 15 06/02/2020   ALT 19 06/02/2020   PROT 8.0 06/02/2020   ALBUMIN 4.7 06/02/2020   CALCIUM 9.6 06/02/2020   ANIONGAP 10 06/02/2020   Lab Results  Component Value Date   CHOL 174 01/27/2016   Lab Results  Component Value Date   HDL 43 (L) 01/27/2016   Lab Results  Component Value Date   LDLCALC 110 01/27/2016   Lab Results  Component Value Date   TRIG 106 01/27/2016   Lab Results   Component Value Date   CHOLHDL 4.0 01/27/2016   No results found for: HGBA1C    Assessment & Plan:   Problem List Items Addressed This Visit   None   No orders of the defined types were placed in this encounter.   Follow-up: No follow-ups on file.    Gerre Scull, NP

## 2021-06-28 NOTE — Assessment & Plan Note (Signed)
Positive dix-hallpike maneuver bilaterally, however worse on the left. Discussed modified epley maneuver to help with symptoms. Will also prescribe meclizine 25mg  TID prn. Make sure to drink plenty of fluids. Follow up if not improving. She has done vestibular rehab in the past.

## 2021-06-28 NOTE — Patient Instructions (Signed)
It was great to see you!  Do the exercise that we discussed once a day to help with dizziness. You can also take meclizine every 8 hours as needed.  Increase your effexor to 225mg  daily. I have sent a refill to your pharmacy.   Keep your next scheduled appointment with me, sooner if you have concerns.  If a referral was placed today, you will be contacted for an appointment. Please note that routine referrals can sometimes take up to 3-4 weeks to process. Please call our office if you haven't heard anything after this time frame.  Take care,  , NP

## 2021-06-28 NOTE — Assessment & Plan Note (Signed)
Chronic, ongoing. She has been out of her venlafaxine for the last week, however feels like she may need to increase her dose. Her PHQ 9 is a 16 and GAD 7 is 11 today. She denies SI/HI. Will increase venlafaxine to 225mg  daily. Keep next scheduled appointment next month.

## 2021-06-28 NOTE — Progress Notes (Signed)
Acute Office Visit  Subjective:    Patient ID: Alicia Moss, female    DOB: 10-Apr-1976, 46 y.o.   MRN: [id number]  Chief Complaint  Patient presents with   Medication Check     Pt is here for medication check *TOC shceduled for 03/17*, pt c/o dizziness, ongoing 2 weeks.     HPI Patient is in today for follow up on anxiety and depression and dizziness.   She has been having dizziness for the last 2 weeks. She went to urgent care because she had an episode of chest pain with the dizziness. She had an EKG that was normal. She decreased the amount of coffee she was drinking and cigarettes she was smoking, which didn't help. She also tried dramamine which didn't help.   DIZZINESS  Duration: weeks Description of symptoms: ill-defined - "fuzziness" Duration of episode:  constant Dizziness frequency: recurrent Provoking factors:  turning her head Aggravating factors:   turning her head Triggered by rolling over in bed: yes Triggered by bending over:  unsure Aggravated by head movement: yes Aggravated by exertion, coughing, loud noises: yes Recent head injury: no Recent or current viral symptoms: no History of vasovagal episodes: no Nausea: yes Vomiting: no Tinnitus: no Hearing loss: no Aural fullness: no Headache:  not often Photophobia/phonophobia: no Unsteady gait: no Postural instability: no Diplopia, dysarthria, dysphagia or weakness: no Related to exertion: no Pallor: no Diaphoresis: yes - also endorses hot flashes Dyspnea: no Chest pain:  not since first episode 2 weeks ago  She endorses she is going through menopause. Her last menstrual period was in November 2022. She has been out of her venlafaxine for the past week. She states her anxiety and depression have worsened recently and she may need to increase her dose. She is unsure if it is because of her hormone fluctuations.   Depression screen [facility] 2/9 06/28/2021 09/24/2020 10/15/2015 10/15/2015 09/25/2015   Decreased Interest 2 2 0 0 0  Down, Depressed, Hopeless 1 0 0 0 0  PHQ - 2 Score 3 2 0 0 0  Altered sleeping 2 1 - - -  Tired, decreased energy 2 1 - - -  Change in appetite 3 0 - - -  Feeling bad or failure about yourself  1 0 - - -  Trouble concentrating 2 0 - - -  Moving slowly or fidgety/restless 2 0 - - -  Suicidal thoughts 1 0 - - -  PHQ-9 Score 16 4 - - -  Difficult doing work/chores Somewhat difficult Not difficult at all - - -   GAD 7 : Generalized Anxiety Score 06/28/2021 09/24/2020  Nervous, Anxious, on Edge 2 0  Control/stop worrying 1 0  Worry too much - different things 1 0  Trouble relaxing 2 0  Restless 2 0  Easily annoyed or irritable 3 1  Afraid - awful might happen 0 0  Total GAD 7 Score 11 1  Anxiety Difficulty Somewhat difficult Not difficult at all    Past Medical History:  Diagnosis Date   Anxiety    Depression    Dizziness     Past Surgical History:  Procedure Laterality Date   slip disk Right    SPINE SURGERY     L4-5, "shaved bone off nerve" per pt    Family History  Problem Relation Age of Onset   Mental illness Sister    Heart disease Brother     Social History   Socioeconomic History   Marital  status: Divorced    Spouse name: Not on file   Number of children: 3   Years of education: 14   Highest education level: Not on file  Occupational History   Occupation: [employer]  Tobacco Use   Smoking status: Every Day    Packs/day: 1.00    Types: Cigarettes   Smokeless tobacco: Never  Substance and Sexual Activity   Alcohol use: No   Drug use: No   Sexual activity: Never  Other Topics Concern   Not on file  Social History Narrative   Lives w/ friend and son   Caffeine use: 1-2 cups per day of coffee/tea   No soda   Social Determinants of Health   Financial Resource Strain: Not on file  Food Insecurity: Not on file  Transportation Needs: Not on file  Physical Activity: Not on file  Stress: Not on file  Social  Connections: Not on file  Intimate Partner Violence: Not on file    Outpatient Medications Prior to Visit  Medication Sig Dispense Refill   Multiple Vitamin (MULTIVITAMIN) capsule Take by mouth.     pregabalin (LYRICA) 100 MG capsule Take 100 mg by mouth 2 (two) times daily.     venlafaxine XR (EFFEXOR-XR) 150 MG 24 hr capsule TAKE ONE CAPSULE BY MOUTH DAILY 30 capsule 0   butalbital-acetaminophen-caffeine (FIORICET) 50-325-40 MG tablet Take 1 tablet by mouth daily as needed for headache. 14 tablet 0   ketorolac (TORADOL) 10 MG tablet Take 1 tablet (10 mg total) by mouth every 6 (six) hours as needed. 20 tablet 0   levonorgestrel (MIRENA) 20 MCG/DAY IUD 1 each by Intrauterine route once.     No facility-administered medications prior to visit.    No Known Allergies  Review of Systems See pertinent positives and negatives per HPI.    Objective:    Physical Exam Vitals and nursing note reviewed.  Constitutional:      General: She is not in acute distress.    Appearance: Normal appearance.  HENT:     Head: Normocephalic.  Eyes:     Extraocular Movements: Extraocular movements intact.     Conjunctiva/sclera: Conjunctivae normal.     Pupils: Pupils are equal, round, and reactive to light.  Cardiovascular:     Rate and Rhythm: Normal rate and regular rhythm.     Pulses: Normal pulses.     Heart sounds: Normal heart sounds.  Pulmonary:     Effort: Pulmonary effort is normal.     Breath sounds: Normal breath sounds.  Musculoskeletal:     Cervical back: Normal range of motion.  Skin:    General: Skin is warm.  Neurological:     General: No focal deficit present.     Mental Status: She is alert and oriented to person, place, and time.     Cranial Nerves: No cranial nerve deficit.     Motor: No weakness.     Coordination: Coordination normal.     Gait: Gait normal.     Comments: Positive Dix-Hallpike maneuver bilaterally, left worse than right  Psychiatric:        Mood and  Affect: Mood normal.        Behavior: Behavior normal.        Thought Content: Thought content normal.        Judgment: Judgment normal.    BP 130/84 (BP Location: Left Arm, Patient Position: Sitting, Cuff Size: Normal)    Pulse 83    Temp 97.7 F (36.5  C) (Temporal)    Wt 178 lb (80.7 kg)    LMP  (LMP Unknown)    SpO2 98%    BMI 34.76 kg/m  Wt Readings from Last 3 Encounters:  06/28/21 178 lb (80.7 kg)  12/17/20 174 lb 9.6 oz (79.2 kg)  09/24/20 175 lb 3.2 oz (79.5 kg)    Health Maintenance Due  Topic Date Due   HIV Screening  Never done   Hepatitis C Screening  Never done    There are no preventive care reminders to display for this patient.   Lab Results  Component Value Date   TSH 1.54 01/27/2016   Lab Results  Component Value Date   WBC 11.2 (H) 06/02/2020   HGB 16.6 (H) 06/02/2020   HCT 48.7 (H) 06/02/2020   MCV 89.7 06/02/2020   PLT 291 06/02/2020   Lab Results  Component Value Date   NA 141 06/02/2020   K 4.1 06/02/2020   CO2 27 06/02/2020   GLUCOSE 98 06/02/2020   BUN 11 06/02/2020   CREATININE 0.85 06/02/2020   BILITOT 0.3 06/02/2020   ALKPHOS 69 06/02/2020   AST 15 06/02/2020   ALT 19 06/02/2020   PROT 8.0 06/02/2020   ALBUMIN 4.7 06/02/2020   CALCIUM 9.6 06/02/2020   ANIONGAP 10 06/02/2020   Lab Results  Component Value Date   CHOL 174 01/27/2016   Lab Results  Component Value Date   HDL 43 (L) 01/27/2016   Lab Results  Component Value Date   LDLCALC 110 01/27/2016   Lab Results  Component Value Date   TRIG 106 01/27/2016   Lab Results  Component Value Date   CHOLHDL 4.0 01/27/2016   No results found for: HGBA1C     Assessment & Plan:   Problem List Items Addressed This Visit       Nervous and Auditory   Benign paroxysmal positional vertigo due to bilateral vestibular disorder - Primary    Positive dix-hallpike maneuver bilaterally, however worse on the left. Discussed modified epley maneuver to help with symptoms. Will  also prescribe meclizine 25mg  TID prn. Make sure to drink plenty of fluids. Follow up if not improving. She has done vestibular rehab in the past.         Other   Depression, recurrent (HCC)    Chronic, ongoing. She has been out of her venlafaxine for the last week, however feels like she may need to increase her dose. Her PHQ 9 is a 16 and GAD 7 is 11 today. She denies SI/HI. Will increase venlafaxine to 225mg  daily. Keep next scheduled appointment next month.        Relevant Medications   Venlafaxine HCl 225 MG TB24     Meds ordered this encounter  Medications   Venlafaxine HCl 225 MG TB24    Sig: Take 1 tablet (225 mg total) by mouth daily.    Dispense:  90 tablet    Refill:  0   meclizine (ANTIVERT) 25 MG tablet    Sig: Take 1 tablet (25 mg total) by mouth 3 (three) times daily as needed for dizziness.    Dispense:  30 tablet    Refill:  0     Charyl Dancer, NP

## 2021-06-30 ENCOUNTER — Telehealth: Payer: Self-pay

## 2021-06-30 DIAGNOSIS — Z419 Encounter for procedure for purposes other than remedying health state, unspecified: Secondary | ICD-10-CM | POA: Diagnosis not present

## 2021-06-30 NOTE — Telephone Encounter (Signed)
Called and clarified with patient correct ins information to be given to pharmacy. Pt UTD and was able to get medication w/o a PA. Sw, cma ?

## 2021-07-16 ENCOUNTER — Other Ambulatory Visit: Payer: Self-pay

## 2021-07-16 ENCOUNTER — Encounter: Payer: Self-pay | Admitting: Nurse Practitioner

## 2021-07-16 ENCOUNTER — Ambulatory Visit (INDEPENDENT_AMBULATORY_CARE_PROVIDER_SITE_OTHER): Payer: Managed Care, Other (non HMO) | Admitting: Nurse Practitioner

## 2021-07-16 ENCOUNTER — Other Ambulatory Visit (HOSPITAL_COMMUNITY)
Admission: RE | Admit: 2021-07-16 | Discharge: 2021-07-16 | Disposition: A | Discharge status: Home / Self Care | Payer: Managed Care, Other (non HMO) | Source: Ambulatory Visit | Attending: Nurse Practitioner | Admitting: Nurse Practitioner

## 2021-07-16 VITALS — BP 124/70 | HR 75 | Temp 97.6°F | Ht 60.0 in | Wt 178.4 lb

## 2021-07-16 DIAGNOSIS — Z136 Encounter for screening for cardiovascular disorders: Secondary | ICD-10-CM

## 2021-07-16 DIAGNOSIS — Z1211 Encounter for screening for malignant neoplasm of colon: Secondary | ICD-10-CM

## 2021-07-16 DIAGNOSIS — Z0000 Encounter for general adult medical examination without abnormal findings: Secondary | ICD-10-CM | POA: Diagnosis not present

## 2021-07-16 DIAGNOSIS — Z124 Encounter for screening for malignant neoplasm of cervix: Secondary | ICD-10-CM

## 2021-07-16 DIAGNOSIS — F339 Major depressive disorder, recurrent, unspecified: Secondary | ICD-10-CM

## 2021-07-16 DIAGNOSIS — Z720 Tobacco use: Secondary | ICD-10-CM

## 2021-07-16 DIAGNOSIS — M545 Low back pain, unspecified: Secondary | ICD-10-CM | POA: Insufficient documentation

## 2021-07-16 DIAGNOSIS — Z1231 Encounter for screening mammogram for malignant neoplasm of breast: Secondary | ICD-10-CM

## 2021-07-16 DIAGNOSIS — Z13220 Encounter for screening for lipoid disorders: Secondary | ICD-10-CM | POA: Diagnosis not present

## 2021-07-16 DIAGNOSIS — G8929 Other chronic pain: Secondary | ICD-10-CM

## 2021-07-16 DIAGNOSIS — E669 Obesity, unspecified: Secondary | ICD-10-CM | POA: Insufficient documentation

## 2021-07-16 DIAGNOSIS — M5441 Lumbago with sciatica, right side: Secondary | ICD-10-CM

## 2021-07-16 DIAGNOSIS — M5442 Lumbago with sciatica, left side: Secondary | ICD-10-CM

## 2021-07-16 DIAGNOSIS — Z23 Encounter for immunization: Secondary | ICD-10-CM

## 2021-07-16 NOTE — Patient Instructions (Signed)
It was great to see you! ? ?I have placed an order for screening mammogram, they should call you to schedule. If you do not hear from them in the next week, please call: ? ?[facility] ?[address] ?[place], [place] ?[phone] ? ?We are checking labs and will let you know the results. I have ordered cologuard to be sent to your house.  ? ?Let's follow-up in 6 months, sooner if you have concerns. ? ?If a referral was placed today, you will be contacted for an appointment. Please note that routine referrals can sometimes take up to 3-4 weeks to process. Please call our office if you haven't heard anything after this time frame. ? ?Take care, ? ?Rodman Pickle, NP ? ?

## 2021-07-16 NOTE — Assessment & Plan Note (Signed)
Ongoing smoking cigarettes daily. Discussed complete tobacco cessation. She is not interested currently.  ?

## 2021-07-16 NOTE — Assessment & Plan Note (Signed)
Chronic, stable. Well controlled with lyrica 100mg  daily. Will continue current regimen. Follow up with any concerns.  ?

## 2021-07-16 NOTE — Assessment & Plan Note (Signed)
Chronic, improved. Symptoms have improved since increasing venlafaxine to 225mg  daily. Will continue this regimen. Follow up in 6 months or sooner with any concerns.  ?

## 2021-07-16 NOTE — Progress Notes (Signed)
? ?BP 124/70 (BP Location: Left Arm, Patient Position: Sitting, Cuff Size: Normal)   Pulse 75   Temp 97.6 ?F (36.4 ?C) (Temporal)   Ht 5' (1.524 m)   Wt 178 lb 6.4 oz (80.9 kg)   LMP  (LMP Unknown)   SpO2 97%   BMI 34.84 kg/m?   ? ?Subjective:  ? ? Patient ID: Alicia Moss, female    DOB: 28-Feb-1976, 46 y.o.   MRN: [id number] ? ?HPI: ?Alicia Moss is a 46 y.o. female presenting on 07/16/2021 for comprehensive medical examination. Current medical complaints include:none ? ?She increased her venlafaxine to 225mg  daily last visit. She states that this helped her depression symptoms significantly. She denies any side effects, SI/HI. ? ?Depression and Anxiety Screen done today and results listed below:  ?Depression screen [facility] 2/9 07/16/2021 06/28/2021 09/24/2020 10/15/2015 10/15/2015  ?Decreased Interest 2 2 2  0 0  ?Down, Depressed, Hopeless 0 1 0 0 0  ?PHQ - 2 Score 2 3 2  0 0  ?Altered sleeping 1 2 1  - -  ?Tired, decreased energy 2 2 1  - -  ?Change in appetite 2 3 0 - -  ?Feeling bad or failure about yourself  0 1 0 - -  ?Trouble concentrating 1 2 0 - -  ?Moving slowly or fidgety/restless 0 2 0 - -  ?Suicidal thoughts 0 1 0 - -  ?PHQ-9 Score 8 16 4  - -  ?Difficult doing work/chores Not difficult at all Somewhat difficult Not difficult at all - -  ? ?GAD 7 : Generalized Anxiety Score 07/16/2021 06/28/2021 09/24/2020  ?Nervous, Anxious, on Edge 0 2 0  ?Control/stop worrying 0 1 0  ?Worry too much - different things 0 1 0  ?Trouble relaxing 1 2 0  ?Restless 0 2 0  ?Easily annoyed or irritable 1 3 1   ?Afraid - awful might happen 0 0 0  ?Total GAD 7 Score 2 11 1   ?Anxiety Difficulty Not difficult at all Somewhat difficult Not difficult at all  ? ? ?The patient does not have a history of falls. I did not complete a risk assessment for falls. A plan of care for falls was not documented. ? ? ?Past Medical History:  ?Past Medical History:  ?Diagnosis Date  ? Anxiety   ? Depression   ? Dizziness   ? ? ?Surgical History:   ?Past Surgical History:  ?Procedure Laterality Date  ? slip disk Right   ? SPINE SURGERY    ? L4-5, "shaved bone off nerve" per pt  ? ? ?Medications:  ?Current Outpatient Medications on File Prior to Visit  ?Medication Sig  ? meclizine (ANTIVERT) 25 MG tablet Take 1 tablet (25 mg total) by mouth 3 (three) times daily as needed for dizziness.  ? Multiple Vitamin (MULTIVITAMIN) capsule Take by mouth.  ? pregabalin (LYRICA) 100 MG capsule Take 100 mg by mouth 2 (two) times daily.  ? Venlafaxine HCl 225 MG TB24 Take 1 tablet (225 mg total) by mouth daily.  ? ?No current facility-administered medications on file prior to visit.  ? ? ?Allergies:  ?No Known Allergies ? ?Social History:  ?Social History  ? ?Socioeconomic History  ? Marital status: Divorced  ?  Spouse name: Not on file  ? Number of children: 3  ? Years of education: 36  ? Highest education level: Not on file  ?Occupational History  ? Occupation: [employer]  ?Tobacco Use  ? Smoking status: Every Day  ?  Packs/day: 1.00  ?  Types: Cigarettes  ? Smokeless tobacco: Never  ?Substance and Sexual Activity  ? Alcohol use: No  ? Drug use: No  ? Sexual activity: Never  ?Other Topics Concern  ? Not on file  ?Social History Narrative  ? Lives w/ friend and son  ? Caffeine use: 1-2 cups per day of coffee/tea  ? No soda  ? ?Social Determinants of Health  ? ?Financial Resource Strain: Not on file  ?Food Insecurity: Not on file  ?Transportation Needs: Not on file  ?Physical Activity: Not on file  ?Stress: Not on file  ?Social Connections: Not on file  ?Intimate Partner Violence: Not on file  ? ?Social History  ? ?Tobacco Use  ?Smoking Status Every Day  ? Packs/day: 1.00  ? Types: Cigarettes  ?Smokeless Tobacco Never  ? ?Social History  ? ?Substance and Sexual Activity  ?Alcohol Use No  ? ? ?Family History:  ?Family History  ?Problem Relation Age of Onset  ? Mental illness Sister   ? Heart disease Brother   ? ? ?Past medical history, surgical history, medications,  allergies, family history and social history reviewed with patient today and changes made to appropriate areas of the chart.  ? ?Review of Systems  ?Constitutional:  Positive for malaise/fatigue.  ?HENT: Negative.    ?Eyes: Negative.   ?Respiratory: Negative.    ?Cardiovascular: Negative.   ?Gastrointestinal: Negative.   ?Genitourinary: Negative.   ?Musculoskeletal:  Positive for back pain.  ?Skin: Negative.   ?Neurological:  Positive for dizziness (better). Negative for headaches.  ?Psychiatric/Behavioral:  Positive for depression (improved).   ?All other ROS negative except what is listed above and in the HPI.  ? ?   ?Objective:  ?  ?BP 124/70 (BP Location: Left Arm, Patient Position: Sitting, Cuff Size: Normal)   Pulse 75   Temp 97.6 ?F (36.4 ?C) (Temporal)   Ht 5' (1.524 m)   Wt 178 lb 6.4 oz (80.9 kg)   LMP  (LMP Unknown)   SpO2 97%   BMI 34.84 kg/m?   ?Wt Readings from Last 3 Encounters:  ?07/16/21 178 lb 6.4 oz (80.9 kg)  ?06/28/21 178 lb (80.7 kg)  ?12/17/20 174 lb 9.6 oz (79.2 kg)  ?  ?Physical Exam ?Vitals and nursing note reviewed. Exam conducted with a chaperone present.  ?Constitutional:   ?   General: She is not in acute distress. ?   Appearance: Normal appearance.  ?HENT:  ?   Head: Normocephalic and atraumatic.  ?   Right Ear: Tympanic membrane, ear canal and external ear normal.  ?   Left Ear: Tympanic membrane, ear canal and external ear normal.  ?   Nose: Nose normal.  ?   Mouth/Throat:  ?   Mouth: Mucous membranes are moist.  ?   Pharynx: Oropharynx is clear.  ?Eyes:  ?   Conjunctiva/sclera: Conjunctivae normal.  ?Cardiovascular:  ?   Rate and Rhythm: Normal rate and regular rhythm.  ?   Pulses: Normal pulses.  ?   Heart sounds: Normal heart sounds.  ?Pulmonary:  ?   Effort: Pulmonary effort is normal.  ?   Breath sounds: Normal breath sounds.  ?Chest:  ?Breasts: ?   Right: Normal.  ?   Left: Normal.  ?Abdominal:  ?   General: Bowel sounds are normal.  ?   Palpations: Abdomen is soft.  ?    Tenderness: There is no abdominal tenderness.  ?Genitourinary: ?   General: Normal vulva.  ?   Exam position: Lithotomy  position.  ?   Labia:     ?   Right: No rash, tenderness or lesion.     ?   Left: No rash, tenderness or lesion.   ?   Vagina: Normal.  ?   Cervix: Normal.  ?   Uterus: Normal.   ?   Adnexa: Right adnexa normal and left adnexa normal.  ?Musculoskeletal:     ?   General: Normal range of motion.  ?   Cervical back: Normal range of motion. No tenderness.  ?   Right lower leg: No edema.  ?   Left lower leg: No edema.  ?   Comments: Strength 5/5 in bilateral upper and lower extremities   ?Lymphadenopathy:  ?   Cervical: No cervical adenopathy.  ?   Upper Body:  ?   Right upper body: No supraclavicular, axillary or pectoral adenopathy.  ?   Left upper body: No supraclavicular, axillary or pectoral adenopathy.  ?Skin: ?   General: Skin is warm and dry.  ?Neurological:  ?   General: No focal deficit present.  ?   Mental Status: She is alert and oriented to person, place, and time.  ?   Cranial Nerves: No cranial nerve deficit.  ?   Coordination: Coordination normal.  ?   Gait: Gait normal.  ?Psychiatric:     ?   Mood and Affect: Mood normal.     ?   Behavior: Behavior normal.     ?   Thought Content: Thought content normal.     ?   Judgment: Judgment normal.  ? ? ?Results for orders placed or performed in visit on 06/02/20  ?CBC with Differential (Cancer Center Only)  ?Result Value Ref Range  ? WBC Count 11.2 (H) 4.0 - 10.5 K/uL  ? RBC 5.43 (H) 3.87 - 5.11 MIL/uL  ? Hemoglobin 16.6 (H) 12.0 - 15.0 g/dL  ? HCT 48.7 (H) 36.0 - 46.0 %  ? MCV 89.7 80.0 - 100.0 fL  ? MCH 30.6 26.0 - 34.0 pg  ? MCHC 34.1 30.0 - 36.0 g/dL  ? RDW 13.0 11.5 - 15.5 %  ? Platelet Count 291 150 - 400 K/uL  ? nRBC 0.0 0.0 - 0.2 %  ? Neutrophils Relative % 69 %  ? Neutro Abs 7.6 1.7 - 7.7 K/uL  ? Lymphocytes Relative 25 %  ? Lymphs Abs 2.8 0.7 - 4.0 K/uL  ? Monocytes Relative 4 %  ? Monocytes Absolute 0.5 0.1 - 1.0 K/uL  ? Eosinophils  Relative 1 %  ? Eosinophils Absolute 0.1 0.0 - 0.5 K/uL  ? Basophils Relative 1 %  ? Basophils Absolute 0.1 0.0 - 0.1 K/uL  ? Immature Granulocytes 0 %  ? Abs Immature Granulocytes 0.05 0.00 - 0.07 K/uL  ?Iron and TIBC  ?Re

## 2021-07-16 NOTE — Assessment & Plan Note (Signed)
BMI 34.8. Information given on diet and exercise. Goal is to lose 1-2 pounds per week.  ?

## 2021-07-17 LAB — COMPREHENSIVE METABOLIC PANEL
AG Ratio: 1.6 (calc) (ref 1.0–2.5)
ALT: 16 U/L (ref 6–29)
AST: 13 U/L (ref 10–35)
Albumin: 4.1 g/dL (ref 3.6–5.1)
Alkaline phosphatase (APISO): 59 U/L (ref 31–125)
BUN: 16 mg/dL (ref 7–25)
CO2: 25 mmol/L (ref 20–32)
Calcium: 9.5 mg/dL (ref 8.6–10.2)
Chloride: 104 mmol/L (ref 98–110)
Creat: 0.72 mg/dL (ref 0.50–0.99)
Globulin: 2.5 g/dL (calc) (ref 1.9–3.7)
Glucose, Bld: 82 mg/dL (ref 65–99)
Potassium: 4.3 mmol/L (ref 3.5–5.3)
Sodium: 139 mmol/L (ref 135–146)
Total Bilirubin: 0.2 mg/dL (ref 0.2–1.2)
Total Protein: 6.6 g/dL (ref 6.1–8.1)

## 2021-07-17 LAB — CBC
HCT: 46.6 % — ABNORMAL HIGH (ref 35.0–45.0)
Hemoglobin: 15.8 g/dL — ABNORMAL HIGH (ref 11.7–15.5)
MCH: 30.2 pg (ref 27.0–33.0)
MCHC: 33.9 g/dL (ref 32.0–36.0)
MCV: 89.1 fL (ref 80.0–100.0)
MPV: 9.9 fL (ref 7.5–12.5)
Platelets: 265 10*3/uL (ref 140–400)
RBC: 5.23 10*6/uL — ABNORMAL HIGH (ref 3.80–5.10)
RDW: 13 % (ref 11.0–15.0)
WBC: 12 10*3/uL — ABNORMAL HIGH (ref 3.8–10.8)

## 2021-07-17 LAB — LIPID PANEL
Cholesterol: 171 mg/dL (ref <200)
HDL: 42 mg/dL — ABNORMAL LOW (ref >=50)
LDL Cholesterol (Calc): 95 mg/dL (calc)
Non-HDL Cholesterol (Calc): 129 mg/dL (calc) (ref <130)
Total CHOL/HDL Ratio: 4.1 (calc) (ref <5.0)
Triglycerides: 256 mg/dL — ABNORMAL HIGH (ref <150)

## 2021-07-20 ENCOUNTER — Encounter: Payer: Self-pay | Admitting: Nurse Practitioner

## 2021-07-20 LAB — CYTOLOGY - PAP
Adequacy: ABSENT
Comment: NEGATIVE
Diagnosis: NEGATIVE
High risk HPV: NEGATIVE

## 2021-07-31 DIAGNOSIS — Z419 Encounter for procedure for purposes other than remedying health state, unspecified: Secondary | ICD-10-CM | POA: Diagnosis not present

## 2021-08-30 DIAGNOSIS — Z419 Encounter for procedure for purposes other than remedying health state, unspecified: Secondary | ICD-10-CM | POA: Diagnosis not present

## 2021-09-24 ENCOUNTER — Other Ambulatory Visit: Payer: Self-pay | Admitting: Nurse Practitioner

## 2021-09-28 NOTE — Telephone Encounter (Signed)
Chart supports rx refill Last ov: 07/16/21 Last refill: 06/30/21

## 2021-09-30 DIAGNOSIS — Z419 Encounter for procedure for purposes other than remedying health state, unspecified: Secondary | ICD-10-CM | POA: Diagnosis not present

## 2021-10-18 ENCOUNTER — Encounter: Payer: Self-pay | Admitting: Physician Assistant

## 2021-10-18 ENCOUNTER — Telehealth: Payer: Self-pay

## 2021-10-18 NOTE — Telephone Encounter (Signed)
Noted  

## 2021-10-18 NOTE — Telephone Encounter (Signed)
Called and spoke to patient, pt stated she does not want to complete cologuard and have discarded kit. Sw, cma

## 2021-10-18 NOTE — Telephone Encounter (Signed)
-----   Message from Gerre Scull, NP sent at 10/14/2021  7:57 AM EDT ----- Can you follow-up on the cologuard with the patient?  ----- Message ----- From: SYSTEM Sent: 10/14/2021  12:17 AM EDT To: Gerre Scull, NP

## 2021-10-30 DIAGNOSIS — Z419 Encounter for procedure for purposes other than remedying health state, unspecified: Secondary | ICD-10-CM | POA: Diagnosis not present

## 2021-11-25 IMAGING — CR DG CHEST 2V
2 series · 2 of 2 positions shown · non-contrast
Comparison: CT angiogram chest 10/14/2014.

CLINICAL DATA: Preop evaluation.

EXAM:
CHEST - 2 VIEW

[w chest pa]
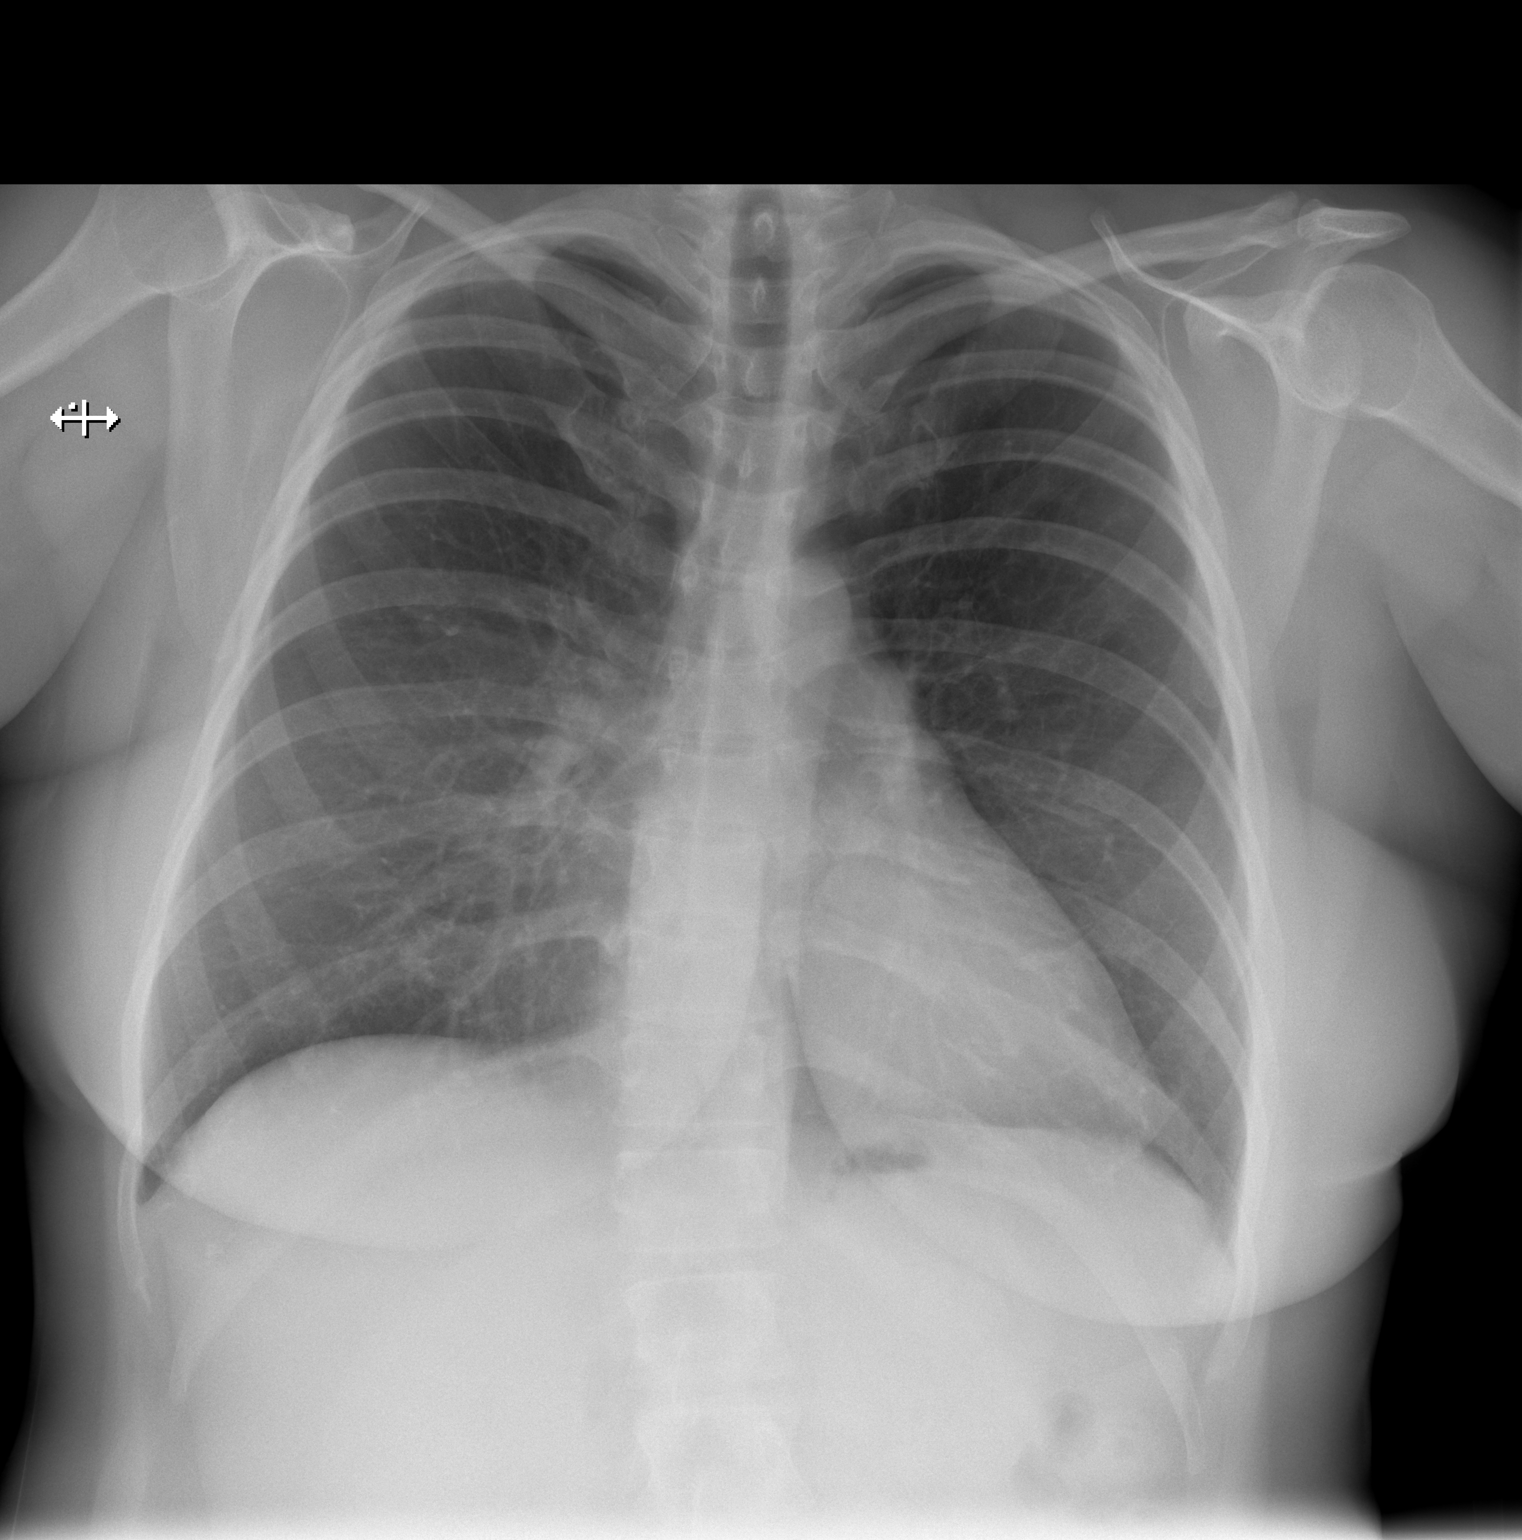

[w chest lat]
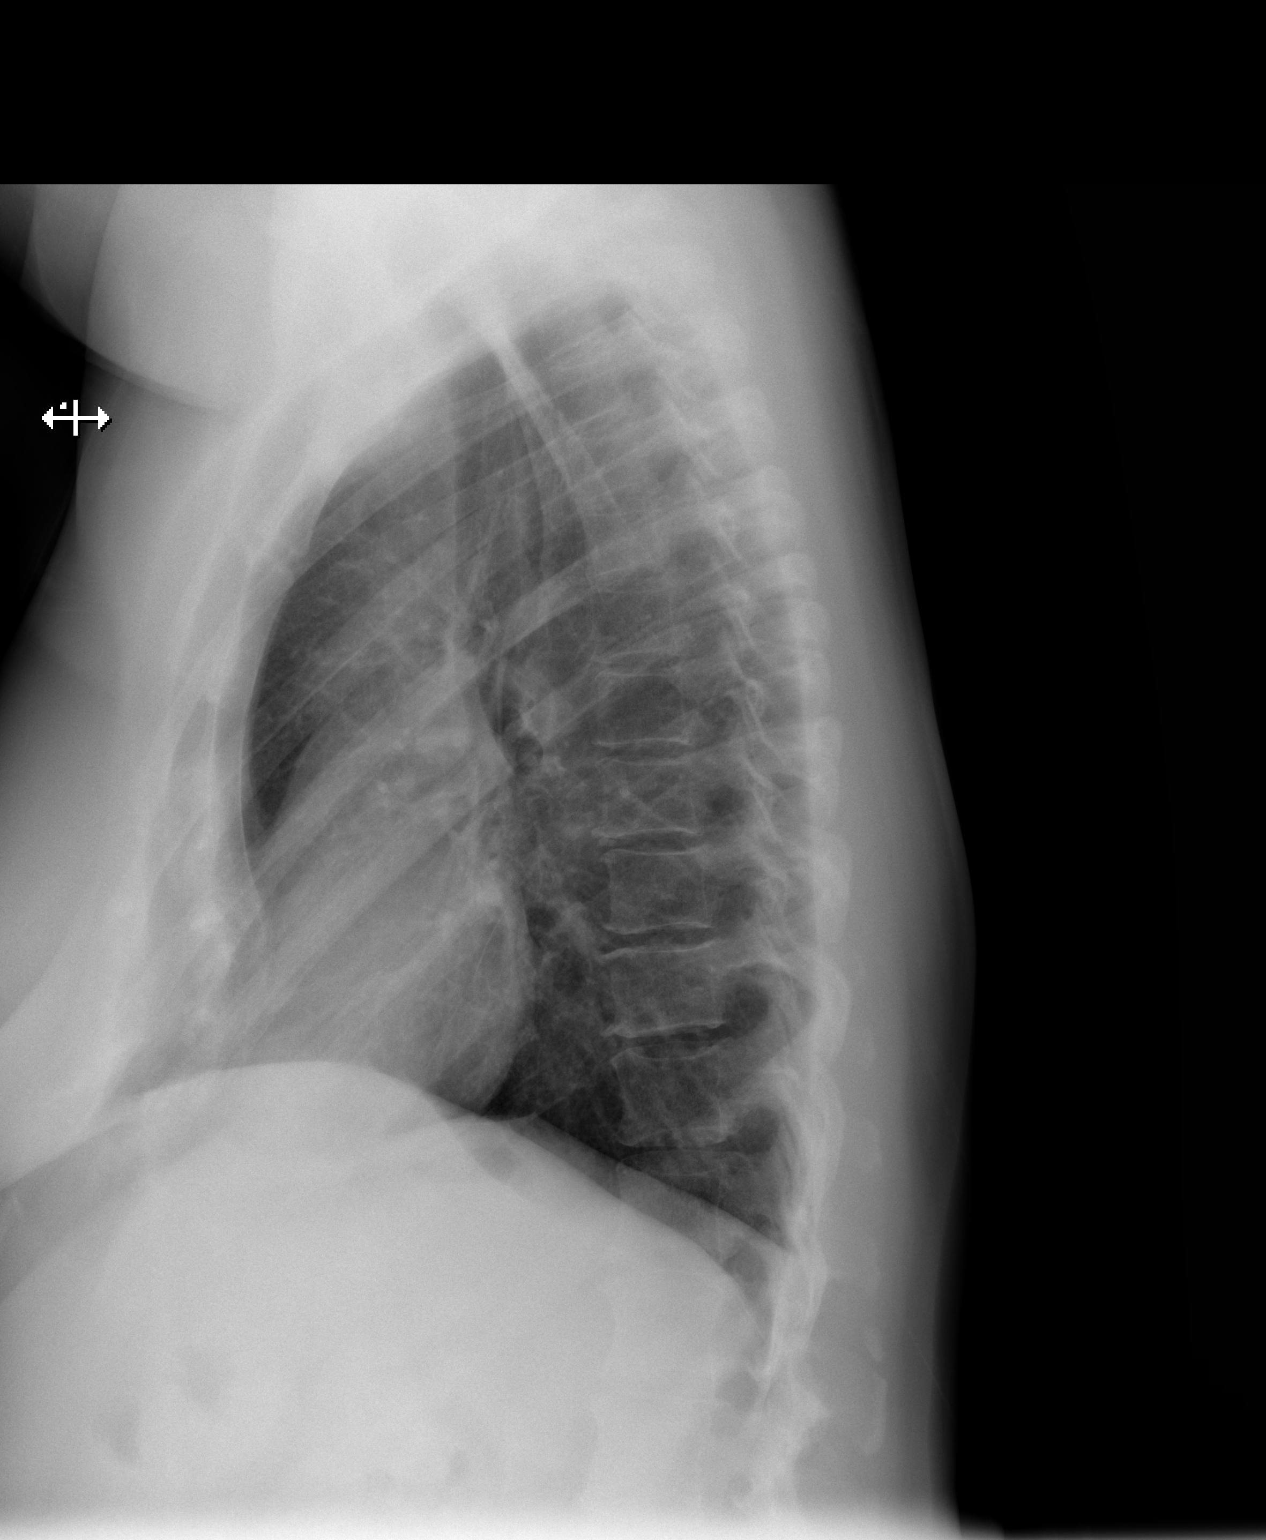

[2 of 2 positions shown; findings below may reference images not displayed]

FINDINGS: Heart size within normal limits. No appreciable airspace
consolidation or pulmonary edema. No evidence of pleural effusion or
pneumothorax. No acute bony abnormality identified. Mild lower
thoracic dextrocurvature.
IMPRESSION: No evidence of active cardiopulmonary disease.

## 2021-11-30 DIAGNOSIS — Z419 Encounter for procedure for purposes other than remedying health state, unspecified: Secondary | ICD-10-CM | POA: Diagnosis not present

## 2021-12-13 NOTE — Progress Notes (Unsigned)
   Established Patient Office Visit  Subjective   Patient ID: Alicia Moss, female    DOB: December 25, 1975  Age: 45 y.o. MRN: [id number]  No chief complaint on file.   HPI  [name] is here to follow-up on anxiety and for smoking cessation.   {History (Optional):23778}  ROS    Objective:     There were no vitals taken for this visit. {Vitals History (Optional):23777}  Physical Exam   No results found for any visits on 12/15/21.  {Labs (Optional):23779}  The 10-year ASCVD risk score (Arnett DK, et al., 2019) is: 3.3%    Assessment & Plan:   Problem List Items Addressed This Visit   None   No follow-ups on file.    Gerre Scull, NP

## 2021-12-15 ENCOUNTER — Ambulatory Visit (INDEPENDENT_AMBULATORY_CARE_PROVIDER_SITE_OTHER): Payer: Managed Care, Other (non HMO) | Admitting: Nurse Practitioner

## 2021-12-15 ENCOUNTER — Encounter: Payer: Self-pay | Admitting: Nurse Practitioner

## 2021-12-15 ENCOUNTER — Encounter: Payer: Self-pay | Admitting: Physician Assistant

## 2021-12-15 VITALS — BP 110/78 | HR 79 | Wt 172.0 lb

## 2021-12-15 DIAGNOSIS — R030 Elevated blood-pressure reading, without diagnosis of hypertension: Secondary | ICD-10-CM

## 2021-12-15 DIAGNOSIS — Z720 Tobacco use: Secondary | ICD-10-CM | POA: Diagnosis not present

## 2021-12-15 DIAGNOSIS — F339 Major depressive disorder, recurrent, unspecified: Secondary | ICD-10-CM

## 2021-12-15 DIAGNOSIS — E669 Obesity, unspecified: Secondary | ICD-10-CM

## 2021-12-15 MED ORDER — NICOTINE 10 MG IN INHA: 1.0000 | RESPIRATORY_TRACT | 1 refills | Status: DC | PRN

## 2021-12-15 MED ORDER — VENLAFAXINE HCL ER 225 MG PO TB24
1.0000 | ORAL_TABLET | Freq: Every day | ORAL | 1 refills | Status: DC
Start: 2021-12-15 — End: 2022-06-27

## 2021-12-15 MED ORDER — WEGOVY 0.25 MG/0.5ML ~~LOC~~ SOAJ
0.2500 mg | SUBCUTANEOUS | 0 refills | Status: DC
Start: 2021-12-15 — End: 2022-04-29

## 2021-12-15 MED ORDER — ALPRAZOLAM 0.25 MG PO TABS: 0.2500 mg | ORAL_TABLET | Freq: Every evening | ORAL | 0 refills | Status: DC | PRN

## 2021-12-15 MED ORDER — NICOTINE 21 MG/24HR TD PT24: 21.0000 mg | MEDICATED_PATCH | Freq: Every day | TRANSDERMAL | 0 refills | Status: DC

## 2021-12-15 NOTE — Assessment & Plan Note (Signed)
Chronic, stable.  She states that her depression is stable with the venlafaxine 225 mg daily, however she feels like her anxiety has increased due to work.  She states that she gets stressed frequently with her busy job and smoking helps with the stress.  She is interested in stopping smoking.  Discussed nonpharmacological ways to help with anxiety including exercise, deep breathing.  We will start at low-dose alprazolam daily as needed in addition to venlafaxine to help with stress and anxiety.  PDMP reviewed.  Discussed possible side effects.  Follow-up in 4 weeks.

## 2021-12-15 NOTE — Assessment & Plan Note (Signed)
He is currently smoking about a pack a day.  Encourage complete tobacco cessation.  She has used Chantix and Wellbutrin in the past, she states that the Wellbutrin did not help completely before.  She is interested in the nicotine inhaler.  Prescription sent to the pharmacy.  Follow-up in 4 weeks.  Addendum: Her insurance did not cover the nicotine inhaler and it is too expensive for her to use.  Reached out and asked if she is interested in the patch.  Awaiting response.

## 2021-12-15 NOTE — Addendum Note (Signed)
Addended by: Rodman Pickle A on: 12/15/2021 04:36 PM   Modules accepted: Orders

## 2021-12-15 NOTE — Assessment & Plan Note (Signed)
Pressure readings have been elevated at home, today it is 110/78.  Discussed that stress, not eating right, salt, and not exercising can lead towards high blood pressure.  I believe that her intermittently elevated blood pressure is due to stress at work.  See plan above for depression.

## 2021-12-15 NOTE — Patient Instructions (Signed)
It was great to see you!  Start Wegovy injection once a week in your arm, leg, or stomach. Make sure you are drinking plenty of fluids.   I have also sent in a nicotrol inhaler that you can use continuously, every 20 minutes to help with smoking cessation.   Keep checking your blood pressure.   Let's follow-up in 4 weeks, sooner if you have concerns.  If a referral was placed today, you will be contacted for an appointment. Please note that routine referrals can sometimes take up to 3-4 weeks to process. Please call our office if you haven't heard anything after this time frame.  Take care,  Rodman Pickle, NP

## 2021-12-15 NOTE — Assessment & Plan Note (Signed)
BMI 33.5.  She has been trying to eat smaller meals, measuring her portion sizes and tracking calories.  She states that she walks a lot at work.  She is interested in medication, we will start Wegovy 0.25 mg injection once a week.  Discussed that these medications have been on backorder, however it should be coming back in the next few weeks according to the manufacturer.  Discussed possible side effects of the medication.  Encouraged her to continue monitoring her portion sizes and increasing exercise.  Follow-up in 6 to 8 weeks.

## 2021-12-31 DIAGNOSIS — Z419 Encounter for procedure for purposes other than remedying health state, unspecified: Secondary | ICD-10-CM | POA: Diagnosis not present

## 2022-01-17 ENCOUNTER — Telehealth: Payer: Self-pay

## 2022-01-17 ENCOUNTER — Ambulatory Visit: Payer: Managed Care, Other (non HMO) | Admitting: Nurse Practitioner

## 2022-01-17 NOTE — Telephone Encounter (Signed)
PA for Venlafaxine HCL 225 mg submitted through cover my meds.  Awaiting response.  Dm/cma   Key: [id number]

## 2022-01-17 NOTE — Telephone Encounter (Signed)
PA denied due to she has primary pharmacy benefit coverage through another plan. Pharmacy should bill the prescription to the primary plan before billing medicaid.  Dm/cma

## 2022-01-21 ENCOUNTER — Ambulatory Visit: Payer: Managed Care, Other (non HMO) | Admitting: Nurse Practitioner

## 2022-01-26 ENCOUNTER — Encounter: Payer: Self-pay | Admitting: Nurse Practitioner

## 2022-01-26 ENCOUNTER — Ambulatory Visit (INDEPENDENT_AMBULATORY_CARE_PROVIDER_SITE_OTHER): Payer: 59 | Admitting: Nurse Practitioner

## 2022-01-26 VITALS — BP 122/76 | HR 90 | Temp 97.6°F | Wt 166.8 lb

## 2022-01-26 DIAGNOSIS — Z23 Encounter for immunization: Secondary | ICD-10-CM | POA: Diagnosis not present

## 2022-01-26 DIAGNOSIS — G8929 Other chronic pain: Secondary | ICD-10-CM | POA: Diagnosis not present

## 2022-01-26 DIAGNOSIS — E669 Obesity, unspecified: Secondary | ICD-10-CM | POA: Diagnosis not present

## 2022-01-26 DIAGNOSIS — M545 Low back pain, unspecified: Secondary | ICD-10-CM | POA: Diagnosis not present

## 2022-01-26 DIAGNOSIS — Z720 Tobacco use: Secondary | ICD-10-CM | POA: Diagnosis not present

## 2022-01-26 MED ORDER — TRIAMCINOLONE ACETONIDE 40 MG/ML IJ SUSP
40.0000 mg | Freq: Once | INTRAMUSCULAR | Status: AC
  Administered 2022-01-26: 40 mg via INTRAMUSCULAR

## 2022-01-26 MED ORDER — SAXENDA 18 MG/3ML ~~LOC~~ SOPN: PEN_INJECTOR | SUBCUTANEOUS | 0 refills | Status: AC

## 2022-01-26 MED ORDER — CYCLOBENZAPRINE HCL 10 MG PO TABS: 10.0000 mg | ORAL_TABLET | Freq: Three times a day (TID) | ORAL | 1 refills | Status: DC | PRN

## 2022-01-26 MED ORDER — INSULIN PEN NEEDLE 29G X 5MM MISC
1.0000 | Freq: Every day | 1 refills | Status: DC
Start: 2022-01-26 — End: 2022-07-15

## 2022-01-26 NOTE — Assessment & Plan Note (Signed)
She has decreased her tobacco use from 1ppd to 1/2 ppd. Congratulated her on this. Discussed chewing gum, deep breathing, taking breaks but not actually smoking to help with cessation. Information printed. Continue using the patch. Follow-up in 6-8 weeks.

## 2022-01-26 NOTE — Patient Instructions (Signed)
It was great to see you!  Start flexeril as needed for muscle spasms/tightness. You can take ibuprofen and use ice/heat as needed for pain.   Start Saxenda injection daily, the dose instructions will come on the box.   Let's follow-up in 8 weeks, sooner if you have concerns.  If a referral was placed today, you will be contacted for an appointment. Please note that routine referrals can sometimes take up to 3-4 weeks to process. Please call our office if you haven't heard anything after this time frame.  Take care,  Vance Peper, NP

## 2022-01-26 NOTE — Assessment & Plan Note (Signed)
She has not been able to get the [facility] injections due to back order. Will start her on Saxenda injection daily with weekly titration. Discussed side effects.

## 2022-01-26 NOTE — Assessment & Plan Note (Signed)
Acute flare-up of pain after loading mulch yesterday. No red flags on exam. Will treat with kenalog 40mg  IM and flexeril prn. Encouraged rest, however not complete bedrest. She can use ice/heat along with tylenol/ibuprofen. Follow-up if symptoms worsen or don't improve.

## 2022-01-26 NOTE — Progress Notes (Signed)
Acute Office Visit  Subjective:     Patient ID: Alicia Moss, female    DOB: 1975/12/01, 46 y.o.   MRN: [id number]  Chief Complaint  Patient presents with   Back Pain    Lower left back pain x 10 years.    HPI Patient is in today for acute on chronic back pain after unloading mulch yesterday. The pain is in her left lower back and does not radiate. She describes it as sharp, stabbing, and severe. The pain worsens with changing positions and walking. Laying flat is the only thing that helps. She has not used heat/ice. She took a pain medication from someone at work which did not help.   She has been using the nicotine patch which has helped some with tobacco cessation, however she is still having cravings. She has gone from smoking 1ppd to 1/2 ppd. She has stopped buying the cartons, so she would need to go to the store more often to get them. She has also been chewing gum to help.   ROS See pertinent positives and negatives per HPI.     Objective:    BP 122/76 (BP Location: Left Arm, Patient Position: Sitting, Cuff Size: Large)   Pulse 90   Temp 97.6 F (36.4 C) (Temporal)   Wt 166 lb 12.8 oz (75.7 kg)   SpO2 95%   BMI 32.58 kg/m    Physical Exam Vitals and nursing note reviewed.  Constitutional:      General: She is not in acute distress.    Appearance: Normal appearance.  HENT:     Head: Normocephalic.  Eyes:     Conjunctiva/sclera: Conjunctivae normal.  Cardiovascular:     Rate and Rhythm: Normal rate and regular rhythm.     Pulses: Normal pulses.     Heart sounds: Normal heart sounds.  Pulmonary:     Effort: Pulmonary effort is normal.     Breath sounds: Normal breath sounds.  Musculoskeletal:        General: Tenderness present. No swelling.     Cervical back: Normal range of motion.     Comments: Lumbar ROM limited due to pain  Skin:    General: Skin is warm.  Neurological:     General: No focal deficit present.     Mental Status: She is alert and  oriented to person, place, and time.  Psychiatric:        Mood and Affect: Mood normal.        Behavior: Behavior normal.        Thought Content: Thought content normal.        Judgment: Judgment normal.      Assessment & Plan:   Problem List Items Addressed This Visit       Other   Tobacco use    She has decreased her tobacco use from 1ppd to 1/2 ppd. Congratulated her on this. Discussed chewing gum, deep breathing, taking breaks but not actually smoking to help with cessation. Information printed. Continue using the patch. Follow-up in 6-8 weeks.       Chronic midline low back pain without sciatica - Primary    Acute flare-up of pain after loading mulch yesterday. No red flags on exam. Will treat with kenalog 40mg  IM and flexeril prn. Encouraged rest, however not complete bedrest. She can use ice/heat along with tylenol/ibuprofen. Follow-up if symptoms worsen or don't improve.       Relevant Medications   cyclobenzaprine (FLEXERIL) 10 MG tablet  Obesity (BMI 30-39.9)    She has not been able to get the [facility] injections due to back order. Will start her on Saxenda injection daily with weekly titration. Discussed side effects.       Relevant Medications   Liraglutide -Weight Management (SAXENDA) 18 MG/3ML SOPN   Other Visit Diagnoses     Need for influenza vaccination       Flu vaccine given today   Relevant Orders   Flu Vaccine QUAD 6+ mos PF IM (Fluarix Quad PF) (Completed)       Meds ordered this encounter  Medications   cyclobenzaprine (FLEXERIL) 10 MG tablet    Sig: Take 1 tablet (10 mg total) by mouth 3 (three) times daily as needed for muscle spasms.    Dispense:  30 tablet    Refill:  1   Liraglutide -Weight Management (SAXENDA) 18 MG/3ML SOPN    Sig: Inject 0.6 mg into the skin daily for 7 days, THEN 1.2 mg daily for 7 days, THEN 1.8 mg daily for 7 days, THEN 2.4 mg daily for 7 days, THEN 3 mg daily.    Dispense:  15 mL    Refill:  0   Insulin Pen Needle  29G X 5MM MISC    Sig: 1 each by Does not apply route daily.    Dispense:  100 each    Refill:  1   triamcinolone acetonide (KENALOG-40) injection 40 mg    Return in about 2 months (around 03/28/2022) for weight management, smoking.  Charyl Dancer, NP

## 2022-01-27 ENCOUNTER — Encounter: Payer: Self-pay | Admitting: Nurse Practitioner

## 2022-01-28 ENCOUNTER — Ambulatory Visit: Payer: Managed Care, Other (non HMO) | Admitting: Nurse Practitioner

## 2022-01-30 DIAGNOSIS — Z419 Encounter for procedure for purposes other than remedying health state, unspecified: Secondary | ICD-10-CM | POA: Diagnosis not present

## 2022-03-02 DIAGNOSIS — Z419 Encounter for procedure for purposes other than remedying health state, unspecified: Secondary | ICD-10-CM | POA: Diagnosis not present

## 2022-03-03 ENCOUNTER — Other Ambulatory Visit: Payer: Self-pay | Admitting: Nurse Practitioner

## 2022-03-25 ENCOUNTER — Encounter: Payer: Self-pay | Admitting: Nurse Practitioner

## 2022-03-28 ENCOUNTER — Ambulatory Visit: Payer: Managed Care, Other (non HMO) | Admitting: Nurse Practitioner

## 2022-04-01 DIAGNOSIS — Z419 Encounter for procedure for purposes other than remedying health state, unspecified: Secondary | ICD-10-CM | POA: Diagnosis not present

## 2022-04-07 ENCOUNTER — Telehealth: Payer: Self-pay | Admitting: Nurse Practitioner

## 2022-04-07 ENCOUNTER — Ambulatory Visit: Payer: Managed Care, Other (non HMO) | Admitting: Nurse Practitioner

## 2022-04-07 NOTE — Telephone Encounter (Signed)
12.7.23 no show letter sent 

## 2022-04-08 NOTE — Telephone Encounter (Signed)
1st no show, fee waived, letter sent 

## 2022-04-08 NOTE — Telephone Encounter (Signed)
Noted  

## 2022-04-29 ENCOUNTER — Encounter: Payer: Self-pay | Admitting: Nurse Practitioner

## 2022-04-29 ENCOUNTER — Ambulatory Visit (INDEPENDENT_AMBULATORY_CARE_PROVIDER_SITE_OTHER): Payer: Managed Care, Other (non HMO) | Admitting: Nurse Practitioner

## 2022-04-29 VITALS — BP 140/90 | HR 80 | Temp 97.1°F | Ht 60.0 in | Wt 164.6 lb

## 2022-04-29 DIAGNOSIS — R030 Elevated blood-pressure reading, without diagnosis of hypertension: Secondary | ICD-10-CM | POA: Diagnosis not present

## 2022-04-29 DIAGNOSIS — E669 Obesity, unspecified: Secondary | ICD-10-CM | POA: Diagnosis not present

## 2022-04-29 DIAGNOSIS — F339 Major depressive disorder, recurrent, unspecified: Secondary | ICD-10-CM

## 2022-04-29 DIAGNOSIS — Z720 Tobacco use: Secondary | ICD-10-CM | POA: Diagnosis not present

## 2022-04-29 MED ORDER — ZEPBOUND 2.5 MG/0.5ML ~~LOC~~ SOAJ: 2.5000 mg | SUBCUTANEOUS | 1 refills | Status: DC

## 2022-04-29 NOTE — Assessment & Plan Note (Signed)
Chronic, ongoing.  She states that her depression and anxiety are doing pretty well right now, however it is just stress that is overwhelming her at the moment.  Her work is very busy and she has unrealistic expectations of her.  Will have her continue the venlafaxine 225 mg daily.  Discussed nonpharmacological methods of helping with stress including exercise, meditation, yoga.  Will give her a few days off of work and she can go back on 05/03/2022.  Follow-up in 6 to 8 weeks.

## 2022-04-29 NOTE — Patient Instructions (Signed)
It was great to see you!  I have ordered zepbound for you to start once a week. Make sure you are drinking plenty of water.   Take the next few days to relax, meditate, focus on you. Get out for a walk.   Let's follow-up in 6-8 weeks, sooner if you have concerns.  If a referral was placed today, you will be contacted for an appointment. Please note that routine referrals can sometimes take up to 3-4 weeks to process. Please call our office if you haven't heard anything after this time frame.  Take care,  Rodman Pickle, NP

## 2022-04-29 NOTE — Assessment & Plan Note (Signed)
Blood pressure is elevated today at 140/90.  She states that she has been having extra stress at work recently and is feeling overwhelmed.  She states that she does not feel like her anxiety is worse, just the stress.  She is hoping to focus more on herself in the upcoming year.  Will give her a few days off of work to help with stress and blood pressure.  Work note given.

## 2022-04-29 NOTE — Assessment & Plan Note (Signed)
BMI is 32.  She has lost another 2 pounds since her last visit, congratulated her on this.  She has stopped putting sugar in her coffee and switched from [employer] to a lower calorie version.  She is trying to track her calories.  She walks frequently at her job.  She is still interested in medication to assist with weight loss.  She has lost 14 pounds since March, congratulated her on this.  Will have her start that pound 2.5 mg injection weekly.  Discussed possible side effects.  She was prescribed Wegovy in the past, however was not able to get this filled due to national backorder.  Will have her follow-up in 6 to 8 weeks.

## 2022-04-29 NOTE — Assessment & Plan Note (Signed)
With the increase in stress in her job, she is back to smoking about a pack a day.  She is not currently using the nicotine patches.  She would like to hold off on smoking cessation at this point, and try again in the next few months when work is less stressful.

## 2022-04-29 NOTE — Progress Notes (Signed)
Established Patient Office Visit  Subjective   Patient ID: Alicia Moss, female    DOB: Feb 12, 1976  Age: 46 y.o. MRN: [id number]  Chief Complaint  Patient presents with   Office Visit    Weight management/ smoking  No concerns     HPI  Alicia Moss is here to follow-up on weight management and smoking cessation.   She states she never started the [facility] as none the pharmacy has had it in stock.  She is still trying to focus on her nutrition, tracking calories, and she moves and walks a lot at work.  She is still interested in medication to help with weight loss.  She states that her work has been extra stressful recently.  She has been feeling overwhelmed and stressed.  She does not feel anxious and states that the venlafaxine is still helping with her mood.  She feels like this is affecting her everyday life and hopes to focus more on herself in 2024.  She states that with work being stressful, she has not stopped smoking and has increased back to where she was before.  She is not using the nicotine patch since she is smoking again.     04/29/2022    9:02 AM 12/15/2021    9:31 AM 07/16/2021    5:02 PM 06/28/2021   10:19 AM 09/24/2020    8:14 AM  Depression screen PHQ 2/9  Decreased Interest 2 2 2 2 2   Down, Depressed, Hopeless 1 0 0 1 0  PHQ - 2 Score 3 2 2 3 2   Altered sleeping 3 2 1 2 1   Tired, decreased energy 3 2 2 2 1   Change in appetite 3 1 2 3  0  Feeling bad or failure about yourself  0 0 0 1 0  Trouble concentrating 3 0 1 2 0  Moving slowly or fidgety/restless 0 0 0 2 0  Suicidal thoughts 0 0 0 1 0  PHQ-9 Score 15 7 8 16 4   Difficult doing work/chores Somewhat difficult  Not difficult at all Somewhat difficult Not difficult at all      04/29/2022    9:02 AM 12/15/2021    9:31 AM 07/16/2021    5:02 PM 06/28/2021   10:20 AM  GAD 7 : Generalized Anxiety Score  Nervous, Anxious, on Edge 0 2 0 2  Control/stop worrying 0 1 0 1  Worry too much - different  things 2 2 0 1  Trouble relaxing 3 2 1 2   Restless 2 1 0 2  Easily annoyed or irritable 1 1 1 3   Afraid - awful might happen 0 1 0 0  Total GAD 7 Score 8 10 2 11   Anxiety Difficulty Somewhat difficult Not difficult at all Not difficult at all Somewhat difficult      ROS See pertinent positives and negatives per HPI.    Objective:     BP (!) 140/90 (BP Location: Right Arm, Cuff Size: Normal)   Pulse 80   Temp (!) 97.1 F (36.2 C) (Temporal)   Ht 5' (1.524 m)   Wt 164 lb 9.6 oz (74.7 kg)   SpO2 98%   BMI 32.15 kg/m  BP Readings from Last 3 Encounters:  04/29/22 (!) 140/90  01/26/22 122/76  12/15/21 110/78   Wt Readings from Last 3 Encounters:  04/29/22 164 lb 9.6 oz (74.7 kg)  01/26/22 166 lb 12.8 oz (75.7 kg)  12/15/21 172 lb (78 kg)    Physical Exam  Vitals and nursing note reviewed.  Constitutional:      General: She is not in acute distress.    Appearance: Normal appearance.  HENT:     Head: Normocephalic.  Eyes:     Conjunctiva/sclera: Conjunctivae normal.  Cardiovascular:     Rate and Rhythm: Normal rate and regular rhythm.     Pulses: Normal pulses.     Heart sounds: Normal heart sounds.  Pulmonary:     Effort: Pulmonary effort is normal.     Breath sounds: Normal breath sounds.  Musculoskeletal:     Cervical back: Normal range of motion.  Skin:    General: Skin is warm.  Neurological:     General: No focal deficit present.     Mental Status: She is alert and oriented to person, place, and time.  Psychiatric:        Mood and Affect: Mood normal.        Behavior: Behavior normal.        Thought Content: Thought content normal.        Judgment: Judgment normal.    The 10-year ASCVD risk score (Arnett DK, et al., 2019) is: 4.3%    Assessment & Plan:   Problem List Items Addressed This Visit       Other   Tobacco use    With the increase in stress in her job, she is back to smoking about a pack a day.  She is not currently using the  nicotine patches.  She would like to hold off on smoking cessation at this point, and try again in the next few months when work is less stressful.      Depression, recurrent (HCC) - Primary    Chronic, ongoing.  She states that her depression and anxiety are doing pretty well right now, however it is just stress that is overwhelming her at the moment.  Her work is very busy and she has unrealistic expectations of her.  Will have her continue the venlafaxine 225 mg daily.  Discussed nonpharmacological methods of helping with stress including exercise, meditation, yoga.  Will give her a few days off of work and she can go back on 05/03/2022.  Follow-up in 6 to 8 weeks.      Obesity (BMI 30-39.9)    BMI is 32.  She has lost another 2 pounds since her last visit, congratulated her on this.  She has stopped putting sugar in her coffee and switched from [employer] to a lower calorie version.  She is trying to track her calories.  She walks frequently at her job.  She is still interested in medication to assist with weight loss.  She has lost 14 pounds since March, congratulated her on this.  Will have her start that pound 2.5 mg injection weekly.  Discussed possible side effects.  She was prescribed Wegovy in the past, however was not able to get this filled due to national backorder.  Will have her follow-up in 6 to 8 weeks.      Elevated blood pressure reading    Blood pressure is elevated today at 140/90.  She states that she has been having extra stress at work recently and is feeling overwhelmed.  She states that she does not feel like her anxiety is worse, just the stress.  She is hoping to focus more on herself in the upcoming year.  Will give her a few days off of work to help with stress and blood pressure.  Work note  given.       Return in about 6 weeks (around 06/10/2022) for 6-8 weeks, weight management, stress.    Gerre Scull, NP

## 2022-05-02 DIAGNOSIS — Z419 Encounter for procedure for purposes other than remedying health state, unspecified: Secondary | ICD-10-CM | POA: Diagnosis not present

## 2022-05-03 MED ORDER — SAXENDA 18 MG/3ML ~~LOC~~ SOPN: PEN_INJECTOR | SUBCUTANEOUS | 0 refills | Status: DC

## 2022-05-26 ENCOUNTER — Telehealth: Payer: Self-pay

## 2022-05-26 ENCOUNTER — Encounter: Payer: Self-pay | Admitting: Physician Assistant

## 2022-05-26 ENCOUNTER — Other Ambulatory Visit (HOSPITAL_COMMUNITY): Payer: Self-pay

## 2022-05-26 NOTE — Telephone Encounter (Signed)
Called and spoke pt informed that her insurance doesn't cover weight loss medication, pt didn't want a referral for healthy weight wellness or nutritionist . Pt said that she was going to look into getting a different insurance that will cover weight loss medication. Pt said she that will let us know when she finds out something.

## 2022-05-26 NOTE — Telephone Encounter (Signed)
Pharmacy Patient Advocate Encounter  Received notification from [facility] that the request for prior authorization for Zepbound has been denied due to   Please be advised we currently do not have a Pharmacist to review denials, therefore you will need to process appeals accordingly as needed. Thanks for your support at this time.  If an appeal is needed, please see below

## 2022-05-26 NOTE — Telephone Encounter (Signed)
Patient Advocate Encounter   Received notification from [facility] that prior authorization for Zepbound 2.5MG /0.5ML is required.   PA submitted on 05/26/2022 Key [id number] Status is pending

## 2022-06-02 DIAGNOSIS — Z419 Encounter for procedure for purposes other than remedying health state, unspecified: Secondary | ICD-10-CM | POA: Diagnosis not present

## 2022-06-13 ENCOUNTER — Other Ambulatory Visit: Payer: Self-pay | Admitting: Nurse Practitioner

## 2022-06-14 MED ORDER — ALPRAZOLAM 0.25 MG PO TABS: 0.2500 mg | ORAL_TABLET | Freq: Every evening | ORAL | 1 refills | Status: DC | PRN

## 2022-06-17 ENCOUNTER — Ambulatory Visit: Payer: Managed Care, Other (non HMO) | Admitting: Nurse Practitioner

## 2022-06-25 ENCOUNTER — Other Ambulatory Visit: Payer: Self-pay | Admitting: Nurse Practitioner

## 2022-07-01 DIAGNOSIS — Z419 Encounter for procedure for purposes other than remedying health state, unspecified: Secondary | ICD-10-CM | POA: Diagnosis not present

## 2022-07-10 ENCOUNTER — Encounter: Payer: Self-pay | Admitting: Nurse Practitioner

## 2022-07-11 ENCOUNTER — Encounter: Payer: Self-pay | Admitting: Physician Assistant

## 2022-07-15 ENCOUNTER — Ambulatory Visit (INDEPENDENT_AMBULATORY_CARE_PROVIDER_SITE_OTHER): Payer: Managed Care, Other (non HMO) | Admitting: Nurse Practitioner

## 2022-07-15 ENCOUNTER — Encounter: Payer: Self-pay | Admitting: Nurse Practitioner

## 2022-07-15 VITALS — BP 132/98 | HR 88 | Temp 97.9°F | Ht 60.0 in | Wt 168.2 lb

## 2022-07-15 DIAGNOSIS — S161XXA Strain of muscle, fascia and tendon at neck level, initial encounter: Secondary | ICD-10-CM

## 2022-07-15 MED ORDER — CYCLOBENZAPRINE HCL 10 MG PO TABS: 10.0000 mg | ORAL_TABLET | Freq: Three times a day (TID) | ORAL | 1 refills | Status: DC | PRN

## 2022-07-15 MED ORDER — MELOXICAM 7.5 MG PO TABS: 7.5000 mg | ORAL_TABLET | Freq: Every day | ORAL | 0 refills | Status: DC

## 2022-07-15 NOTE — Patient Instructions (Signed)
It was great to see you!  Keep doing the heat, TENS machine, stretches for your neck.  Start flexeril 1 tablet 3 times a day as needed for pain/tightness, this may make you sleepy.   Start meloxicam 1 tablet daily with food as needed for pain.   Let's follow-up if your symptoms worsen or don't improve.   Take care,  Vance Peper, NP

## 2022-07-15 NOTE — Progress Notes (Signed)
Acute Office Visit  Subjective:     Patient ID: Alicia Moss, female    DOB: [birth year], 47 y.o.   MRN: [id number]  Chief Complaint  Patient presents with   Neck Pain    Started on right side and now on left side-went to Chiropractor    HPI Patient is in today for neck pain that started 2 weeks ago.   She states that she started having right neck and shoulder pain 2 weeks ago.  She went to a chiropractor and had x-rays done.  She had used her TENS machine, heat, and had an adjustment done.  Her neck pain on the right side got better and now it is currently on her left side.  Starts in her neck and goes down to her upper shoulder.  She again has been using her TENS machine and heat.  She has also tried Tylenol, Goody powder, and Aleve.  She denies injury.  She states that she thought it was a stress headache at first.  Turning her head makes the pain worse. The pain does not radiate down her arm.   ROS See pertinent positives and negatives per HPI.     Objective:    BP (!) 132/98 (BP Location: Right Arm, Cuff Size: Large)   Pulse 88   Temp 97.9 F (36.6 C)   Ht 5' (1.524 m)   Wt 168 lb 3.2 oz (76.3 kg)   SpO2 95%   BMI 32.85 kg/m    Physical Exam Vitals and nursing note reviewed.  Constitutional:      General: She is not in acute distress.    Appearance: Normal appearance.  HENT:     Head: Normocephalic.  Eyes:     Conjunctiva/sclera: Conjunctivae normal.  Cardiovascular:     Rate and Rhythm: Normal rate and regular rhythm.     Pulses: Normal pulses.     Heart sounds: Normal heart sounds.  Pulmonary:     Effort: Pulmonary effort is normal.     Breath sounds: Normal breath sounds.  Musculoskeletal:        General: Tenderness (left anterior neck and upper shoulder) present. No swelling.     Cervical back: Normal range of motion.     Comments: Limited cervical spine ROM due to pain. Normal shoulder ROM. Negative spurling test  Skin:    General: Skin is warm.   Neurological:     General: No focal deficit present.     Mental Status: She is alert and oriented to person, place, and time.  Psychiatric:        Mood and Affect: Mood normal.        Behavior: Behavior normal.        Thought Content: Thought content normal.        Judgment: Judgment normal.       Assessment & Plan:   Problem List Items Addressed This Visit       Musculoskeletal and Integument   Cervical strain - Primary    Symptoms started 2 weeks ago and started on the right side of her neck and out his left side of her neck.  No red flags on exam today.  She does have limited range of motion due to pain.  Will have her start Flexeril 10 mg 3 times daily as needed muscle tightness/spasms.  Discussed this may make her sleepy.  Will also have her start meloxicam 7.5 mg daily as needed with food.  Continue using TENS machine and  heat.  She can also do stretches daily.  Follow-up if symptoms worsen or do not improve.       Meds ordered this encounter  Medications   cyclobenzaprine (FLEXERIL) 10 MG tablet    Sig: Take 1 tablet (10 mg total) by mouth 3 (three) times daily as needed for muscle spasms.    Dispense:  30 tablet    Refill:  1   meloxicam (MOBIC) 7.5 MG tablet    Sig: Take 1 tablet (7.5 mg total) by mouth daily.    Dispense:  30 tablet    Refill:  0    Return if symptoms worsen or fail to improve.  Charyl Dancer, NP

## 2022-07-15 NOTE — Assessment & Plan Note (Signed)
Symptoms started 2 weeks ago and started on the right side of her neck and out his left side of her neck.  No red flags on exam today.  She does have limited range of motion due to pain.  Will have her start Flexeril 10 mg 3 times daily as needed muscle tightness/spasms.  Discussed this may make her sleepy.  Will also have her start meloxicam 7.5 mg daily as needed with food.  Continue using TENS machine and heat.  She can also do stretches daily.  Follow-up if symptoms worsen or do not improve.

## 2022-07-28 ENCOUNTER — Other Ambulatory Visit: Payer: Self-pay | Admitting: Nurse Practitioner

## 2022-07-28 ENCOUNTER — Encounter: Payer: Self-pay | Admitting: Nurse Practitioner

## 2022-07-28 NOTE — Progress Notes (Signed)
[facility] [address] [place] [address] Dept: [phone] Dept Fax: [phone]  Virtual Video Visit  I connected with Lorenda Ishihara on 08/01/22 at 11:20 AM EDT by a video enabled telemedicine application and verified that I am speaking with the correct person using two identifiers.  Location patient: Home Location provider: Clinic Persons participating in the virtual visit: Patient; Vance Peper, NP; Eino Farber, CMA  I discussed the limitations of evaluation and management by telemedicine and the availability of in person appointments. The patient expressed understanding and agreed to proceed.  Chief Complaint  Patient presents with   Discuss Leave of Absence    No other concerns    SUBJECTIVE:  HPI: IN SHIDLER is a 47 y.o. female who presents to discuss grief with her brother's recent passing.   7 years ago, her brother was in an accident and paralyzed. She was his POA. He was put into hospice in 12-Jun-2022 and passed away last 08/12/22. She states that since then, her she has been having increased stress at her job and they are reassigning her team and was told this on the day her brother passed. She has been having to take xanax 0.5mg  in the morning and 0.25mg  in the afternoon. She is still taking her venlafaxine 225mg  daily. She feels like she may need to have intermittent leave of absence from work due to stress and anxiety. She denies SI/HI.   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   Tobacco use 06/02/2020    Past  Surgical History:  Procedure Laterality Date   slip disk Right    SPINE SURGERY     L4-5, "shaved bone off nerve" per pt    Family History  Problem Relation Age of Onset   Mental illness Sister    Heart disease Brother     Social History   Tobacco Use   Smoking status: Every Day    Packs/day: 1    Types: Cigarettes   Smokeless tobacco: Never  Substance Use Topics   Alcohol use: No   Drug use: No     Current Outpatient Medications:    cyclobenzaprine (FLEXERIL) 10 MG tablet, Take 1 tablet (10 mg total) by mouth 3 (three) times daily as needed for muscle spasms., Disp: 30 tablet, Rfl: 1   meloxicam (MOBIC) 7.5 MG tablet, Take 1 tablet (7.5 mg total) by mouth daily., Disp: 30 tablet, Rfl: 0   Multiple Vitamin (MULTIVITAMIN) capsule, Take by mouth., Disp: , Rfl:    nicotine (NICODERM CQ - DOSED IN MG/24 HOURS) 21 mg/24hr patch, Place 1 patch (21 mg total) onto the skin daily., Disp: 28 patch, Rfl: 0   Venlafaxine HCl 225 MG TB24, TAKE 1 TABLET BY MOUTH DAILY, Disp: 90 tablet, Rfl: 0   ALPRAZolam (XANAX) 0.25 MG tablet, Take 1 tablet (0.25 mg total) by mouth 3 (three) times daily as needed for anxiety., Disp: 90 tablet, Rfl: 0  No Known Allergies  ROS: See pertinent positives and negatives per HPI.  OBSERVATIONS/OBJECTIVE:  VITALS per patient if applicable: Today's Vitals   08/01/22 1122  Weight: 173 lb (78.5 kg)  Height: 5' (1.524 m)   Body mass index is 33.79 kg/m.    GENERAL: Alert and oriented. Appears well and in no acute distress.  HEENT: Atraumatic. Conjunctiva clear. No obvious abnormalities on inspection of external nose and ears.  NECK: Normal movements of the head and neck.  LUNGS: On inspection, no signs of respiratory distress. Breathing rate appears normal. No obvious gross SOB, gasping or wheezing, and no conversational dyspnea.  CV: No obvious cyanosis.  MS: Moves all visible extremities without noticeable abnormality.  PSYCH/NEURO: Pleasant  and cooperative. No obvious depression or anxiety. Speech and thought processing grossly intact.  ASSESSMENT AND PLAN:  Problem List Items Addressed This Visit       Other   Anxiety and depression - Primary    Chronic, not controlled.  She states that her symptoms has worsened with the recent passing of her brother a week ago.  She is having increased rest at work.  Will have her continue venlafaxine 225 mg daily and Xanax 0.25 mg 3 times daily as needed.  PDMP reviewed, refill sent to the pharmacy.  With her increased stress at work, recommend that she start talking with a therapist again and possibly consider looking for a new job.  For now with recent passing of her brother, will fill out intermittent FMLA as needed 1 to 2 days/week.  Follow-up in 3 months.      Relevant Medications   ALPRAZolam (XANAX) 0.25 MG tablet     I discussed the assessment and treatment plan with the patient. The patient was provided an opportunity to ask questions and all were answered. The patient agreed with the plan and demonstrated an understanding of the instructions.   The patient was advised to call back or seek an in-person evaluation if the symptoms worsen or if the condition fails to improve as anticipated.   Charyl Dancer, NP

## 2022-07-28 NOTE — Telephone Encounter (Signed)
Refill request for  Venlafaxine ER 225 mg LR 06/27/22, #30, 0 rf LOV  07/15/22 FOV none scheduled.    Please review and advise.   Dm/cma

## 2022-08-01 ENCOUNTER — Encounter: Payer: Self-pay | Admitting: Nurse Practitioner

## 2022-08-01 ENCOUNTER — Telehealth (INDEPENDENT_AMBULATORY_CARE_PROVIDER_SITE_OTHER): Payer: Managed Care, Other (non HMO) | Admitting: Nurse Practitioner

## 2022-08-01 VITALS — Ht 60.0 in | Wt 173.0 lb

## 2022-08-01 DIAGNOSIS — F419 Anxiety disorder, unspecified: Secondary | ICD-10-CM | POA: Diagnosis not present

## 2022-08-01 DIAGNOSIS — Z419 Encounter for procedure for purposes other than remedying health state, unspecified: Secondary | ICD-10-CM | POA: Diagnosis not present

## 2022-08-01 DIAGNOSIS — F32A Depression, unspecified: Secondary | ICD-10-CM | POA: Diagnosis not present

## 2022-08-01 MED ORDER — ALPRAZOLAM 0.25 MG PO TABS: 0.2500 mg | ORAL_TABLET | Freq: Three times a day (TID) | ORAL | 0 refills | Status: DC | PRN

## 2022-08-01 NOTE — Assessment & Plan Note (Signed)
Chronic, not controlled.  She states that her symptoms has worsened with the recent passing of her brother a week ago.  She is having increased rest at work.  Will have her continue venlafaxine 225 mg daily and Xanax 0.25 mg 3 times daily as needed.  PDMP reviewed, refill sent to the pharmacy.  With her increased stress at work, recommend that she start talking with a therapist again and possibly consider looking for a new job.  For now with recent passing of her brother, will fill out intermittent FMLA as needed 1 to 2 days/week.  Follow-up in 3 months.

## 2022-08-01 NOTE — Patient Instructions (Signed)
It was great to see you!  I have increased your xanax to 1 tablet 3 times a day as needed. Keep taking the venlafaxine. Continue therapy through work or reach back out to your other therapist.  I have sent you a letter for intermittent FMLA, please send me any forms for completion.  Let's follow-up in 3 months, sooner if you have concerns.  If a referral was placed today, you will be contacted for an appointment. Please note that routine referrals can sometimes take up to 3-4 weeks to process. Please call our office if you haven't heard anything after this time frame.  Take care,  Vance Peper, NP

## 2022-08-06 ENCOUNTER — Encounter: Payer: Self-pay | Admitting: Nurse Practitioner

## 2022-08-08 NOTE — Progress Notes (Unsigned)
   Acute Office Visit  Subjective:     Patient ID: Alicia Moss, female    DOB: 06/12/1975, 47 y.o.   MRN: [id number]  No chief complaint on file.   HPI Patient is in today for ***  ROS      Objective:    There were no vitals taken for this visit. {Vitals History (Optional):23777}  Physical Exam  No results found for any visits on 08/09/22.      Assessment & Plan:   Problem List Items Addressed This Visit   None   No orders of the defined types were placed in this encounter.   No follow-ups on file.  Gerre Scull, NP

## 2022-08-09 ENCOUNTER — Ambulatory Visit (INDEPENDENT_AMBULATORY_CARE_PROVIDER_SITE_OTHER): Payer: Managed Care, Other (non HMO) | Admitting: Nurse Practitioner

## 2022-08-09 ENCOUNTER — Encounter: Payer: Self-pay | Admitting: Nurse Practitioner

## 2022-08-09 VITALS — BP 160/110 | HR 73 | Temp 97.3°F | Ht 60.0 in | Wt 165.0 lb

## 2022-08-09 DIAGNOSIS — I10 Essential (primary) hypertension: Secondary | ICD-10-CM | POA: Diagnosis not present

## 2022-08-09 DIAGNOSIS — R519 Headache, unspecified: Secondary | ICD-10-CM

## 2022-08-09 MED ORDER — CYCLOBENZAPRINE HCL 10 MG PO TABS: 10.0000 mg | ORAL_TABLET | Freq: Three times a day (TID) | ORAL | 1 refills | Status: AC | PRN

## 2022-08-09 MED ORDER — KETOROLAC TROMETHAMINE 60 MG/2ML IM SOLN
30.0000 mg | Freq: Once | INTRAMUSCULAR | Status: AC
  Administered 2022-08-09: 30 mg via INTRAMUSCULAR

## 2022-08-09 MED ORDER — VALSARTAN-HYDROCHLOROTHIAZIDE 160-25 MG PO TABS: 1.0000 | ORAL_TABLET | Freq: Every day | ORAL | 1 refills | Status: DC

## 2022-08-09 NOTE — Assessment & Plan Note (Signed)
Controlled.  Her blood pressure has been elevated over 140 for the past several months.  Today her blood pressure is 160/110.  This could be the cause of the headache that she has been having for the past 9 days.  Will have her start valsartan-hydrochlorothiazide 160-25 mg daily.  Start checking her blood pressure and writing it down at home.  Follow-up in 2 to 3 weeks.

## 2022-08-09 NOTE — Patient Instructions (Signed)
It was great to see you!  We are giving you a toradol injection today for your headache.   Start valsartan-hctz 1 tablet daily for your blood pressure. Start checking your blood pressure daily at home.   Let's follow-up in 2-3 weeks, sooner if you have concerns.  If a referral was placed today, you will be contacted for an appointment. Please note that routine referrals can sometimes take up to 3-4 weeks to process. Please call our office if you haven't heard anything after this time frame.  Take care,  Rodman Pickle, NP

## 2022-08-09 NOTE — Assessment & Plan Note (Signed)
Acute x 9 days.  She denies weakness, slurred speech.  No red flags on exam.  Blood pressure is elevated today 160/110.  Between recent stress and increased blood pressure, this could be the cause of her acute headache.  Starting blood pressure medication as noted above.  Will give Toradol 30 mg IM in office today.  Continue taking Tylenol as needed for pain.  Will also refill her Flexeril to take as needed for muscle tightness.  Follow-up in 2 to 3 weeks.  ER precautions discussed

## 2022-08-24 ENCOUNTER — Ambulatory Visit (INDEPENDENT_AMBULATORY_CARE_PROVIDER_SITE_OTHER): Payer: Managed Care, Other (non HMO) | Admitting: Nurse Practitioner

## 2022-08-24 ENCOUNTER — Encounter: Payer: Self-pay | Admitting: Nurse Practitioner

## 2022-08-24 ENCOUNTER — Encounter: Payer: Self-pay | Admitting: Physician Assistant

## 2022-08-24 VITALS — BP 136/88 | HR 76 | Temp 97.0°F | Ht 60.0 in | Wt 166.0 lb

## 2022-08-24 DIAGNOSIS — I10 Essential (primary) hypertension: Secondary | ICD-10-CM | POA: Diagnosis not present

## 2022-08-24 DIAGNOSIS — R5383 Other fatigue: Secondary | ICD-10-CM | POA: Diagnosis not present

## 2022-08-24 NOTE — Patient Instructions (Signed)
It was great to see you!  We are checking your labs today and will let you know the results via mychart/phone.   Keep checking your blood pressure at home.   Stop taking the valsartan.   Keep drinking fluids.  Let's follow-up based on your labs, sooner if you have concerns.  If a referral was placed today, you will be contacted for an appointment. Please note that routine referrals can sometimes take up to 3-4 weeks to process. Please call our office if you haven't heard anything after this time frame.  Take care,  Rodman Pickle, NP

## 2022-08-24 NOTE — Progress Notes (Signed)
Established Patient Office Visit  Subjective   Patient ID: Alicia Moss, female    DOB: 08/27/1975  Age: 47 y.o. MRN: [id number]  Chief Complaint  Patient presents with   Hypertension    Low blood pressure started Saturday, fatigue    HPI  Alicia Moss is here to follow-up on her blood pressure and fatigue.  She states that she started having fatigue since starting the valsartan-hydrochlorothiazide 160-25 mg daily.  After starting this medication however her headache has gone away.  She has been checking her blood pressure at home and it was 127/84.  Then this last weekend she was working out in her yard and felt really weak and like her heart was pounding.  She checked her blood pressure and it was 70/20.  She went and sat down and had a snack and drink some water.  She did not take her blood pressure medicine in the next day was 80/20.  Again she has not taken her blood pressure medication.  Today she is still feeling fatigued.  She states that she is feeling slightly less stressed.  She denies headaches, chest pain, and shortness of breath.     ROS See pertinent positives and negatives per HPI.    Objective:     BP 136/88 (BP Location: Left Arm)   Pulse 76   Temp (!) 97 F (36.1 C)   Ht 5' (1.524 m)   Wt 166 lb (75.3 kg)   SpO2 95%   BMI 32.42 kg/m  BP Readings from Last 3 Encounters:  08/24/22 136/88  08/09/22 (!) 160/110  07/15/22 (!) 132/98   Wt Readings from Last 3 Encounters:  08/24/22 166 lb (75.3 kg)  08/09/22 165 lb (74.8 kg)  08/01/22 173 lb (78.5 kg)      Physical Exam Vitals and nursing note reviewed.  Constitutional:      General: She is not in acute distress.    Appearance: Normal appearance.  HENT:     Head: Normocephalic.  Eyes:     Conjunctiva/sclera: Conjunctivae normal.  Cardiovascular:     Rate and Rhythm: Normal rate and regular rhythm.     Pulses: Normal pulses.     Heart sounds: Normal heart sounds.  Pulmonary:      Effort: Pulmonary effort is normal.     Breath sounds: Normal breath sounds.  Abdominal:     Palpations: Abdomen is soft.     Tenderness: There is no abdominal tenderness.  Musculoskeletal:     Cervical back: Normal range of motion.  Skin:    General: Skin is warm.  Neurological:     General: No focal deficit present.     Mental Status: She is alert and oriented to person, place, and time.  Psychiatric:        Mood and Affect: Mood normal.        Behavior: Behavior normal.        Thought Content: Thought content normal.        Judgment: Judgment normal.    The 10-year ASCVD risk score (Arnett DK, et al., 2019) is: 5%    Assessment & Plan:   Problem List Items Addressed This Visit       Cardiovascular and Mediastinum   Primary hypertension - Primary    She started the valsartan-hydrochlorothiazide 160-25 mg daily and she started noticing fatigue.  Her blood pressure after a week had dropped down to 127/84.  She had taking the medication and then 4 days  ago her blood pressure was 70/20.  She stopped taking the medication since then.  Today her blood pressure was 136/88.  Will have her continue not to take his blood pressure medication.  Continue checking blood pressure at home.  Limit salt in her diet.  Will keep her out of work until Monday.  Check CMP, CBC, lipid panel today.       Relevant Orders   CBC (Completed)   Comprehensive metabolic panel (Completed)   Lipid panel (Completed)   Other Visit Diagnoses     Fatigue, unspecified type       Check CMP, CBC, TSH, iron panel, vitamin D, vitamin B12, and cortisol today. Most likely related to low BP. Stop diovan. Encourage fluids/rest.   Relevant Orders   CBC (Completed)   Comprehensive metabolic panel (Completed)   Cortisol (Completed)   Iron, TIBC and Ferritin Panel   TSH (Completed)   Vitamin B12 (Completed)   VITAMIN D 25 Hydroxy (Vit-D Deficiency, Fractures) (Completed)       Return if symptoms worsen or fail  to improve.    Gerre Scull, NP

## 2022-08-25 ENCOUNTER — Encounter: Payer: Self-pay | Admitting: Nurse Practitioner

## 2022-08-25 ENCOUNTER — Other Ambulatory Visit (INDEPENDENT_AMBULATORY_CARE_PROVIDER_SITE_OTHER): Payer: Managed Care, Other (non HMO)

## 2022-08-25 DIAGNOSIS — I10 Essential (primary) hypertension: Secondary | ICD-10-CM

## 2022-08-25 DIAGNOSIS — R5383 Other fatigue: Secondary | ICD-10-CM | POA: Diagnosis not present

## 2022-08-25 LAB — CBC
HCT: 46.6 % — ABNORMAL HIGH (ref 36.0–46.0)
Hemoglobin: 15.7 g/dL — ABNORMAL HIGH (ref 12.0–15.0)
MCHC: 33.7 g/dL (ref 30.0–36.0)
MCV: 90.4 fl (ref 78.0–100.0)
Platelets: 252 10*3/uL (ref 150.0–400.0)
RBC: 5.15 Mil/uL — ABNORMAL HIGH (ref 3.87–5.11)
RDW: 13.6 % (ref 11.5–15.5)
WBC: 7.4 10*3/uL (ref 4.0–10.5)

## 2022-08-25 LAB — COMPREHENSIVE METABOLIC PANEL
ALT: 16 U/L (ref 0–35)
AST: 13 U/L (ref 0–37)
Albumin: 4.1 g/dL (ref 3.5–5.2)
Alkaline Phosphatase: 60 U/L (ref 39–117)
BUN: 9 mg/dL (ref 6–23)
CO2: 26 mEq/L (ref 19–32)
Calcium: 9 mg/dL (ref 8.4–10.5)
Chloride: 109 mEq/L (ref 96–112)
Creatinine, Ser: 0.84 mg/dL (ref 0.40–1.20)
GFR: 82.09 mL/min (ref >60.00)
Glucose, Bld: 83 mg/dL (ref 70–99)
Potassium: 4.5 mEq/L (ref 3.5–5.1)
Sodium: 141 mEq/L (ref 135–145)
Total Bilirubin: 0.3 mg/dL (ref 0.2–1.2)
Total Protein: 6.4 g/dL (ref 6.0–8.3)

## 2022-08-25 LAB — LIPID PANEL
Cholesterol: 202 mg/dL — ABNORMAL HIGH (ref 0–200)
HDL: 43.3 mg/dL (ref >39.00)
LDL Cholesterol: 120 mg/dL — ABNORMAL HIGH (ref 0–99)
NonHDL: 158.77
Total CHOL/HDL Ratio: 5
Triglycerides: 195 mg/dL — ABNORMAL HIGH (ref 0.0–149.0)
VLDL: 39 mg/dL (ref 0.0–40.0)

## 2022-08-25 LAB — VITAMIN B12: Vitamin B-12: 620 pg/mL (ref 211–911)

## 2022-08-25 LAB — VITAMIN D 25 HYDROXY (VIT D DEFICIENCY, FRACTURES): VITD: 19.48 ng/mL — ABNORMAL LOW (ref 30.00–100.00)

## 2022-08-25 LAB — TSH: TSH: 1.43 u[IU]/mL (ref 0.35–5.50)

## 2022-08-25 LAB — CORTISOL: Cortisol, Plasma: 9.1 ug/dL

## 2022-08-25 MED ORDER — VITAMIN D (ERGOCALCIFEROL) 1.25 MG (50000 UNIT) PO CAPS: 50000.0000 [IU] | ORAL_CAPSULE | ORAL | 0 refills | Status: DC

## 2022-08-25 NOTE — Assessment & Plan Note (Addendum)
She started the valsartan-hydrochlorothiazide 160-25 mg daily and she started noticing fatigue.  Her blood pressure after a week had dropped down to 127/84.  She had taking the medication and then 4 days ago her blood pressure was 70/20.  She stopped taking the medication since then.  Today her blood pressure was 136/88.  Will have her continue not to take his blood pressure medication.  Continue checking blood pressure at home.  Limit salt in her diet.  Will keep her out of work until Monday.  Check CMP, CBC, lipid panel today.

## 2022-08-26 LAB — IRON,TIBC AND FERRITIN PANEL
%SAT: 25 % (calc) (ref 16–45)
Ferritin: 62 ng/mL (ref 16–232)
Iron: 84 ug/dL (ref 40–190)
TIBC: 340 mcg/dL (calc) (ref 250–450)

## 2022-08-30 ENCOUNTER — Ambulatory Visit: Payer: Managed Care, Other (non HMO) | Admitting: Nurse Practitioner

## 2022-08-31 DIAGNOSIS — Z419 Encounter for procedure for purposes other than remedying health state, unspecified: Secondary | ICD-10-CM | POA: Diagnosis not present

## 2022-09-01 ENCOUNTER — Encounter: Payer: Self-pay | Admitting: Nurse Practitioner

## 2022-09-14 ENCOUNTER — Other Ambulatory Visit: Payer: Self-pay | Admitting: Nurse Practitioner

## 2022-09-14 ENCOUNTER — Encounter: Payer: Self-pay | Admitting: Nurse Practitioner

## 2022-09-15 MED ORDER — LISINOPRIL 5 MG PO TABS
5.0000 mg | ORAL_TABLET | Freq: Every day | ORAL | 0 refills | Status: DC
Start: 2022-09-15 — End: 2022-10-10

## 2022-09-15 NOTE — Telephone Encounter (Signed)
Refill request for  Alprazolam 0.25 mg LR  08/01/22, #90, 0 rf LOV 08/24/22 FOV  none scheduled.    Please review and advise.  Thanks.  Dm/cma

## 2022-09-15 NOTE — Addendum Note (Signed)
Addended by: Rodman Pickle A on: 09/15/2022 01:01 PM   Modules accepted: Orders

## 2022-10-01 DIAGNOSIS — Z419 Encounter for procedure for purposes other than remedying health state, unspecified: Secondary | ICD-10-CM | POA: Diagnosis not present

## 2022-10-10 ENCOUNTER — Encounter: Payer: Self-pay | Admitting: Nurse Practitioner

## 2022-10-10 ENCOUNTER — Ambulatory Visit (INDEPENDENT_AMBULATORY_CARE_PROVIDER_SITE_OTHER): Payer: Managed Care, Other (non HMO) | Admitting: Nurse Practitioner

## 2022-10-10 VITALS — BP 132/90 | HR 84 | Temp 97.1°F | Ht 60.0 in | Wt 163.0 lb

## 2022-10-10 DIAGNOSIS — I10 Essential (primary) hypertension: Secondary | ICD-10-CM | POA: Diagnosis not present

## 2022-10-10 MED ORDER — LISINOPRIL 10 MG PO TABS
10.0000 mg | ORAL_TABLET | Freq: Every day | ORAL | 0 refills | Status: DC
Start: 2022-10-10 — End: 2023-01-09

## 2022-10-10 NOTE — Patient Instructions (Signed)
It was great to see you!  Increase your lisinopril to 10mg  daily. You can take 2 of your current tablets, then when you pick up your new bottle, go back down to 1 tablet daily.   Keep checking your blood pressure at home and let me know if it drops below 100 on the top number.   Let's follow-up in 3 months, sooner if you have concerns.  If a referral was placed today, you will be contacted for an appointment. Please note that routine referrals can sometimes take up to 3-4 weeks to process. Please call our office if you haven't heard anything after this time frame.  Take care,  Rodman Pickle, NP

## 2022-10-10 NOTE — Progress Notes (Signed)
   Established Patient Office Visit  Subjective   Patient ID: KAYTELYNN SCRIPTER, female    DOB: 11-May-1975  Age: 47 y.o. MRN: [id number]  Chief Complaint  Patient presents with   Hypertension    Follow up, no concerns    HPI  WRENLEY SAYED is here to follow-up on hypertension.   HYPERTENSION  Hypertension status: better  Satisfied with current treatment? yes Duration of hypertension: months BP monitoring frequency:  daily BP range: 130-140s/90s-100s BP medication side effects:  no Medication compliance: excellent compliance Previous BP meds:lisinopril Aspirin: no Recurrent headaches: no Visual changes: no Palpitations: no Dyspnea: no Chest pain: no Lower extremity edema: no Dizzy/lightheaded: no    ROS See pertinent positives and negatives per HPI.    Objective:     BP (!) 132/90 (BP Location: Right Arm, Cuff Size: Large)   Pulse 84   Temp (!) 97.1 F (36.2 C)   Ht 5' (1.524 m)   Wt 163 lb (73.9 kg)   SpO2 96%   BMI 31.83 kg/m  BP Readings from Last 3 Encounters:  10/10/22 (!) 132/90  08/24/22 136/88  08/09/22 (!) 160/110   Wt Readings from Last 3 Encounters:  10/10/22 163 lb (73.9 kg)  08/24/22 166 lb (75.3 kg)  08/09/22 165 lb (74.8 kg)   Physical Exam Vitals and nursing note reviewed.  Constitutional:      General: She is not in acute distress.    Appearance: Normal appearance.  HENT:     Head: Normocephalic.  Eyes:     Conjunctiva/sclera: Conjunctivae normal.  Cardiovascular:     Rate and Rhythm: Normal rate and regular rhythm.     Pulses: Normal pulses.     Heart sounds: Normal heart sounds.  Pulmonary:     Effort: Pulmonary effort is normal.     Breath sounds: Normal breath sounds.  Musculoskeletal:     Cervical back: Normal range of motion.  Skin:    General: Skin is warm.  Neurological:     General: No focal deficit present.     Mental Status: She is alert and oriented to person, place, and time.  Psychiatric:         Mood and Affect: Mood normal.        Behavior: Behavior normal.        Thought Content: Thought content normal.        Judgment: Judgment normal.    The 10-year ASCVD risk score (Arnett DK, et al., 2019) is: 6.4%    Assessment & Plan:   Problem List Items Addressed This Visit       Cardiovascular and Mediastinum   Primary hypertension - Primary    Chronic, improving but still not controlled.  Will increase lisinopril to 10 mg daily.  Continue checking blood pressure daily and writing it down.  Follow-up in 3 months or sooner with concerns.      Relevant Medications   lisinopril (ZESTRIL) 10 MG tablet    Return in about 3 months (around 01/10/2023) for HTN.    Gerre Scull, NP

## 2022-10-10 NOTE — Assessment & Plan Note (Signed)
Chronic, improving but still not controlled.  Will increase lisinopril to 10 mg daily.  Continue checking blood pressure daily and writing it down.  Follow-up in 3 months or sooner with concerns.

## 2022-10-11 ENCOUNTER — Other Ambulatory Visit: Payer: Self-pay | Admitting: Nurse Practitioner

## 2022-10-13 ENCOUNTER — Encounter: Payer: Self-pay | Admitting: Physician Assistant

## 2022-10-19 ENCOUNTER — Encounter: Payer: Self-pay | Admitting: Physician Assistant

## 2022-10-27 ENCOUNTER — Other Ambulatory Visit: Payer: Self-pay | Admitting: Nurse Practitioner

## 2022-10-27 NOTE — Telephone Encounter (Signed)
Requesting: VENLAFAXINE HCL ER 225 MG TAB  Last Visit: 10/10/2022 Next Visit: Visit date not found Last Refill: 07/28/2022  Please Advise

## 2022-10-31 DIAGNOSIS — Z419 Encounter for procedure for purposes other than remedying health state, unspecified: Secondary | ICD-10-CM | POA: Diagnosis not present

## 2022-11-01 ENCOUNTER — Encounter: Payer: Self-pay | Admitting: Physician Assistant

## 2022-11-16 ENCOUNTER — Other Ambulatory Visit: Payer: Self-pay | Admitting: Nurse Practitioner

## 2022-11-18 NOTE — Telephone Encounter (Signed)
LVM to return call.

## 2022-11-21 ENCOUNTER — Other Ambulatory Visit: Payer: Self-pay | Admitting: Nurse Practitioner

## 2022-11-21 ENCOUNTER — Encounter: Payer: Self-pay | Admitting: Nurse Practitioner

## 2022-11-22 NOTE — Telephone Encounter (Signed)
I called and spoke with patient and notified her of below message,

## 2022-11-23 ENCOUNTER — Other Ambulatory Visit: Payer: Self-pay | Admitting: Nurse Practitioner

## 2022-12-01 DIAGNOSIS — Z419 Encounter for procedure for purposes other than remedying health state, unspecified: Secondary | ICD-10-CM | POA: Diagnosis not present

## 2022-12-12 ENCOUNTER — Ambulatory Visit: Payer: Managed Care, Other (non HMO) | Admitting: Nurse Practitioner

## 2022-12-27 ENCOUNTER — Encounter: Payer: Self-pay | Admitting: Physician Assistant

## 2023-01-01 DIAGNOSIS — Z419 Encounter for procedure for purposes other than remedying health state, unspecified: Secondary | ICD-10-CM | POA: Diagnosis not present

## 2023-01-07 ENCOUNTER — Other Ambulatory Visit: Payer: Self-pay | Admitting: Nurse Practitioner

## 2023-01-09 NOTE — Telephone Encounter (Signed)
Requesting: LISINOPRIL 10 MG TABLET  Last Visit: 10/10/2022 Next Visit: Visit date not found Last Refill: 10/10/2022  Please Advise

## 2023-01-09 NOTE — Telephone Encounter (Signed)
Patient needs an appointment

## 2023-01-10 ENCOUNTER — Other Ambulatory Visit: Payer: Self-pay | Admitting: Physician Assistant

## 2023-01-10 DIAGNOSIS — M961 Postlaminectomy syndrome, not elsewhere classified: Secondary | ICD-10-CM

## 2023-01-10 DIAGNOSIS — M5416 Radiculopathy, lumbar region: Secondary | ICD-10-CM

## 2023-01-20 ENCOUNTER — Encounter: Payer: Self-pay | Admitting: Physician Assistant

## 2023-01-21 ENCOUNTER — Other Ambulatory Visit: Payer: Managed Care, Other (non HMO)

## 2023-01-31 DIAGNOSIS — Z419 Encounter for procedure for purposes other than remedying health state, unspecified: Secondary | ICD-10-CM | POA: Diagnosis not present

## 2023-02-10 ENCOUNTER — Other Ambulatory Visit: Payer: Managed Care, Other (non HMO)

## 2023-02-10 ENCOUNTER — Other Ambulatory Visit: Payer: Self-pay | Admitting: Nurse Practitioner

## 2023-02-10 NOTE — Telephone Encounter (Signed)
Requesting:  LISINOPRIL 10 MG TABLET  Last Visit: 10/10/2022 Next Visit: Visit date not found Last Refill: 01/09/2023  Please Advise    I called patient to schedule an appointment and made her an appointment for Monday and then she said that she will call back on Monday and schedule an appointment. She said that she has 5 pills left.

## 2023-02-13 ENCOUNTER — Ambulatory Visit: Payer: Managed Care, Other (non HMO) | Admitting: Nurse Practitioner

## 2023-02-18 ENCOUNTER — Other Ambulatory Visit: Payer: Self-pay | Admitting: Nurse Practitioner

## 2023-02-24 ENCOUNTER — Encounter: Payer: Self-pay | Admitting: Nurse Practitioner

## 2023-02-24 ENCOUNTER — Ambulatory Visit (INDEPENDENT_AMBULATORY_CARE_PROVIDER_SITE_OTHER): Payer: Managed Care, Other (non HMO) | Admitting: Nurse Practitioner

## 2023-02-24 VITALS — BP 110/70 | HR 96 | Temp 97.6°F | Ht 60.0 in | Wt 165.8 lb

## 2023-02-24 DIAGNOSIS — Z23 Encounter for immunization: Secondary | ICD-10-CM

## 2023-02-24 DIAGNOSIS — E559 Vitamin D deficiency, unspecified: Secondary | ICD-10-CM | POA: Insufficient documentation

## 2023-02-24 DIAGNOSIS — I10 Essential (primary) hypertension: Secondary | ICD-10-CM

## 2023-02-24 DIAGNOSIS — Z720 Tobacco use: Secondary | ICD-10-CM

## 2023-02-24 DIAGNOSIS — G8929 Other chronic pain: Secondary | ICD-10-CM

## 2023-02-24 DIAGNOSIS — M545 Low back pain, unspecified: Secondary | ICD-10-CM

## 2023-02-24 LAB — HM HIV SCREENING LAB: HM HIV Screening: NEGATIVE

## 2023-02-24 NOTE — Progress Notes (Signed)
Established Patient Office Visit  Subjective   Patient ID: Alicia Moss, female    DOB: 11-19-1975  Age: 47 y.o. MRN: [id number]  Chief Complaint  Patient presents with   Hypertension    Follow up    HPI:  Discussed the use of AI scribe software for clinical note transcription with the patient, who gave verbal consent to proceed.  History of Present Illness   The patient, with a history of hypertension, presents with worsening back pain. She recently lost her job and took a month off, during which she slept a lot and felt potentially depressed. 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 However, she has noticed an increase in her back pain, which she attributes to sitting at a desk for extended periods. She has seen a back doctor and is awaiting insurance approval for a procedure. She has been prescribed tramadol for the pain, but reports that it is not providing much relief. She also tried physical therapy with dry needling, but believes it may have worsened her condition.  The patient's blood pressure is well-controlled on lisinopril, and she has not been checking it at home. She reports that she can tell if her blood pressure is high or low based on the location of her headaches. She denies any chest pain or shortness of breath. She has restarted taking over-the-counter vitamin D supplements. The patient admits to smoking, but not as much as before. She usually smokes when she gets up from her desk to walk around due to leg pain.      ROS See pertinent positives and negatives per HPI.    Objective:     BP 110/70 (BP Location: Right Arm)   Pulse 96   Temp 97.6 F (36.4 C)   Ht 5' (1.524 m)   Wt 165 lb 12.8 oz (75.2 kg)   SpO2 96%   BMI 32.38 kg/m     Physical Exam Vitals and nursing note reviewed.  Constitutional:      General: She is not in acute distress.    Appearance: Normal  appearance.  HENT:     Head: Normocephalic.  Eyes:     Conjunctiva/sclera: Conjunctivae normal.  Cardiovascular:     Rate and Rhythm: Normal rate and regular rhythm.     Pulses: Normal pulses.     Heart sounds: Normal heart sounds.  Pulmonary:     Effort: Pulmonary effort is normal.     Breath sounds: Normal breath sounds.  Musculoskeletal:     Cervical back: Normal range of motion.  Skin:    General: Skin is warm.  Neurological:     General: No focal deficit present.     Mental Status: She is alert and oriented to person, place, and time.  Psychiatric:        Mood and Affect: Mood normal.        Behavior: Behavior normal.        Thought Content: Thought content normal.        Judgment: Judgment normal.      Results for orders placed or performed in visit on 02/24/23  [facility] HIV SCREENING LAB  Result Value Ref Range   [facility] HIV Screening Negative - Patient reported   [facility] HEPATITIS C SCREENING LAB  Result Value Ref Range   [facility] Hepatitis Screen Negative - Patient Reported        The 10-year ASCVD risk  score (Arnett DK, et al., 2019) is: 4.6%    Assessment & Plan:   Problem List Items Addressed This Visit       Cardiovascular and Mediastinum   Primary hypertension - Primary    Well controlled on Lisinopril without symptoms of headache suggestive of hypertensive urgency. We will continue Lisinopril 10mg  daily and check BMP and CBC today.      Relevant Orders   Basic metabolic panel   CBC with Differential/Platelet     Other   Tobacco use    She has reduced smoking. We will encourage smoking cessation.      Chronic midline low back pain without sciatica    Pain has worsened, not relieved by Tramadol or physical therapy, while awaiting insurance approval for surgery. We will continue Tramadol as needed for pain and follow up with the back surgeon pending insurance approval.      Relevant Medications   traMADol (ULTRAM) 50 MG tablet   Vitamin D deficiency    She is  on over-the-counter supplementation. We will check Vitamin D levels today.      Relevant Orders   VITAMIN D 25 Hydroxy (Vit-D Deficiency, Fractures)   Other Visit Diagnoses     Immunization due       Flu vaccine given today.   Relevant Orders   Flu vaccine trivalent PF, 6mos and older(Flulaval,Afluria,Fluarix,Fluzone) (Completed)       Return in about 6 months (around 08/25/2023) for CPE.    Gerre Scull, NP

## 2023-02-24 NOTE — Assessment & Plan Note (Signed)
Pain has worsened, not relieved by Tramadol or physical therapy, while awaiting insurance approval for surgery. We will continue Tramadol as needed for pain and follow up with the back surgeon pending insurance approval.

## 2023-02-24 NOTE — Assessment & Plan Note (Signed)
She has reduced smoking. We will encourage smoking cessation.

## 2023-02-24 NOTE — Patient Instructions (Signed)
It was great to see you!  We are checking your labs today and will let you know the results via mychart/phone.   Let me know if your surgeon needs anything from me.   Let's follow-up in 6 months, sooner if you have concerns.  If a referral was placed today, you will be contacted for an appointment. Please note that routine referrals can sometimes take up to 3-4 weeks to process. Please call our office if you haven't heard anything after this time frame.  Take care,  Rodman Pickle, NP

## 2023-02-24 NOTE — Assessment & Plan Note (Signed)
Well controlled on Lisinopril without symptoms of headache suggestive of hypertensive urgency. We will continue Lisinopril 10mg  daily and check BMP and CBC today.

## 2023-02-24 NOTE — Assessment & Plan Note (Signed)
She is on over-the-counter supplementation. We will check Vitamin D levels today.

## 2023-02-25 LAB — BASIC METABOLIC PANEL
BUN: 15 mg/dL (ref 7–25)
CO2: 26 mmol/L (ref 20–32)
Calcium: 9.5 mg/dL (ref 8.6–10.2)
Chloride: 105 mmol/L (ref 98–110)
Creat: 0.93 mg/dL (ref 0.50–0.99)
Glucose, Bld: 84 mg/dL (ref 65–99)
Potassium: 4.2 mmol/L (ref 3.5–5.3)
Sodium: 144 mmol/L (ref 135–146)

## 2023-02-25 LAB — CBC WITH DIFFERENTIAL/PLATELET
Absolute Lymphocytes: 2632 {cells}/uL (ref 850–3900)
Absolute Monocytes: 599 {cells}/uL (ref 200–950)
Basophils Absolute: 48 {cells}/uL (ref 0–200)
Basophils Relative: 0.5 %
Eosinophils Absolute: 143 {cells}/uL (ref 15–500)
Eosinophils Relative: 1.5 %
HCT: 49.2 % — ABNORMAL HIGH (ref 35.0–45.0)
Hemoglobin: 16.1 g/dL — ABNORMAL HIGH (ref 11.7–15.5)
MCH: 30.3 pg (ref 27.0–33.0)
MCHC: 32.7 g/dL (ref 32.0–36.0)
MCV: 92.5 fL (ref 80.0–100.0)
MPV: 9.4 fL (ref 7.5–12.5)
Monocytes Relative: 6.3 %
Neutro Abs: 6080 {cells}/uL (ref 1500–7800)
Neutrophils Relative %: 64 %
Platelets: 266 10*3/uL (ref 140–400)
RBC: 5.32 10*6/uL — ABNORMAL HIGH (ref 3.80–5.10)
RDW: 12.4 % (ref 11.0–15.0)
Total Lymphocyte: 27.7 %
WBC: 9.5 10*3/uL (ref 3.8–10.8)

## 2023-02-25 LAB — VITAMIN D 25 HYDROXY (VIT D DEFICIENCY, FRACTURES): Vit D, 25-Hydroxy: 49 ng/mL (ref 30–100)

## 2023-03-03 DIAGNOSIS — Z419 Encounter for procedure for purposes other than remedying health state, unspecified: Secondary | ICD-10-CM | POA: Diagnosis not present

## 2023-03-10 ENCOUNTER — Other Ambulatory Visit: Payer: Self-pay | Admitting: Nurse Practitioner

## 2023-03-10 NOTE — Telephone Encounter (Signed)
Requesting:  LISINOPRIL 10 MG TABLET Last Visit: 02/24/2023 Next Visit: No FOV Last Refill: 02/10/2023  Please Advise

## 2023-03-15 ENCOUNTER — Other Ambulatory Visit: Payer: Self-pay | Admitting: Orthopaedic Surgery

## 2023-03-15 DIAGNOSIS — M961 Postlaminectomy syndrome, not elsewhere classified: Secondary | ICD-10-CM

## 2023-03-15 DIAGNOSIS — M5416 Radiculopathy, lumbar region: Secondary | ICD-10-CM

## 2023-03-21 ENCOUNTER — Encounter: Payer: Self-pay | Admitting: Physician Assistant

## 2023-03-26 ENCOUNTER — Other Ambulatory Visit: Payer: Managed Care, Other (non HMO)

## 2023-04-02 DIAGNOSIS — Z419 Encounter for procedure for purposes other than remedying health state, unspecified: Secondary | ICD-10-CM | POA: Diagnosis not present

## 2023-04-04 ENCOUNTER — Encounter: Payer: Self-pay | Admitting: Nurse Practitioner

## 2023-04-10 ENCOUNTER — Ambulatory Visit
Admission: RE | Admit: 2023-04-10 | Discharge: 2023-04-10 | Disposition: A | Discharge status: Home / Self Care | Payer: Managed Care, Other (non HMO) | Source: Ambulatory Visit | Attending: Orthopaedic Surgery | Admitting: Orthopaedic Surgery

## 2023-04-10 DIAGNOSIS — M961 Postlaminectomy syndrome, not elsewhere classified: Secondary | ICD-10-CM

## 2023-04-10 DIAGNOSIS — M5416 Radiculopathy, lumbar region: Secondary | ICD-10-CM

## 2023-05-03 DIAGNOSIS — Z419 Encounter for procedure for purposes other than remedying health state, unspecified: Secondary | ICD-10-CM | POA: Diagnosis not present

## 2023-05-25 ENCOUNTER — Other Ambulatory Visit: Payer: Self-pay | Admitting: Nurse Practitioner

## 2023-05-25 NOTE — Telephone Encounter (Signed)
Requesting: VENLAFAXINE HCL ER 225 MG TAB  Last Visit: 02/24/2023 Next Visit: Visit date not found Last Refill: 02/20/2023  Please Advise

## 2023-06-03 DIAGNOSIS — Z419 Encounter for procedure for purposes other than remedying health state, unspecified: Secondary | ICD-10-CM | POA: Diagnosis not present

## 2023-06-06 ENCOUNTER — Other Ambulatory Visit: Payer: Self-pay | Admitting: Nurse Practitioner

## 2023-07-01 DIAGNOSIS — Z419 Encounter for procedure for purposes other than remedying health state, unspecified: Secondary | ICD-10-CM | POA: Diagnosis not present

## 2023-07-05 ENCOUNTER — Other Ambulatory Visit: Payer: Self-pay | Admitting: Nurse Practitioner

## 2023-07-05 NOTE — Telephone Encounter (Signed)
 Requesting: LISINOPRIL 10 MG TABLET  Last Visit: 02/24/2023 Next Visit: Visit date not found Last Refill: 06/06/2023  Please Advise

## 2023-07-13 ENCOUNTER — Encounter: Payer: Self-pay | Admitting: Nurse Practitioner

## 2023-07-13 DIAGNOSIS — E559 Vitamin D deficiency, unspecified: Secondary | ICD-10-CM

## 2023-07-17 ENCOUNTER — Encounter: Payer: Self-pay | Admitting: Nurse Practitioner

## 2023-07-17 ENCOUNTER — Other Ambulatory Visit (INDEPENDENT_AMBULATORY_CARE_PROVIDER_SITE_OTHER)

## 2023-07-17 DIAGNOSIS — E559 Vitamin D deficiency, unspecified: Secondary | ICD-10-CM | POA: Diagnosis not present

## 2023-07-17 LAB — VITAMIN D 25 HYDROXY (VIT D DEFICIENCY, FRACTURES): VITD: 19.8 ng/mL — ABNORMAL LOW (ref 30.00–100.00)

## 2023-07-17 MED ORDER — VITAMIN D (ERGOCALCIFEROL) 1.25 MG (50000 UNIT) PO CAPS: 50000.0000 [IU] | ORAL_CAPSULE | ORAL | 0 refills | Status: DC

## 2023-07-17 NOTE — Addendum Note (Signed)
 Addended by: Rodman Pickle A on: 07/17/2023 02:50 PM   Modules accepted: Orders

## 2023-07-19 ENCOUNTER — Ambulatory Visit: Payer: Managed Care, Other (non HMO) | Admitting: Nurse Practitioner

## 2023-08-12 DIAGNOSIS — Z419 Encounter for procedure for purposes other than remedying health state, unspecified: Secondary | ICD-10-CM | POA: Diagnosis not present

## 2023-08-25 ENCOUNTER — Other Ambulatory Visit: Payer: Self-pay | Admitting: Nurse Practitioner

## 2023-08-25 NOTE — Telephone Encounter (Signed)
 Requesting: VENLAFAXINE  HCL ER 225 MG TAB  Last Visit: 02/24/2023 Next Visit: Visit date not found Last Refill: 05/25/2023  Please Advise    Patient due for CPE 08/25/23

## 2023-09-11 ENCOUNTER — Ambulatory Visit (INDEPENDENT_AMBULATORY_CARE_PROVIDER_SITE_OTHER): Admitting: Nurse Practitioner

## 2023-09-11 ENCOUNTER — Encounter: Payer: Self-pay | Admitting: Nurse Practitioner

## 2023-09-11 VITALS — BP 110/72 | HR 98 | Temp 97.0°F | Ht 60.0 in | Wt 169.0 lb

## 2023-09-11 DIAGNOSIS — E559 Vitamin D deficiency, unspecified: Secondary | ICD-10-CM | POA: Diagnosis not present

## 2023-09-11 DIAGNOSIS — M545 Low back pain, unspecified: Secondary | ICD-10-CM

## 2023-09-11 DIAGNOSIS — F32A Depression, unspecified: Secondary | ICD-10-CM

## 2023-09-11 DIAGNOSIS — I10 Essential (primary) hypertension: Secondary | ICD-10-CM | POA: Diagnosis not present

## 2023-09-11 DIAGNOSIS — Z419 Encounter for procedure for purposes other than remedying health state, unspecified: Secondary | ICD-10-CM | POA: Diagnosis not present

## 2023-09-11 DIAGNOSIS — F419 Anxiety disorder, unspecified: Secondary | ICD-10-CM

## 2023-09-11 DIAGNOSIS — G8929 Other chronic pain: Secondary | ICD-10-CM

## 2023-09-11 MED ORDER — BUPROPION HCL ER (XL) 150 MG PO TB24: 150.0000 mg | ORAL_TABLET | Freq: Every day | ORAL | 1 refills | Status: DC

## 2023-09-11 NOTE — Patient Instructions (Addendum)
 It was great to see you!  Start wellbutrin once a day in addition to the venlafaxine    We are checking your labs today and will let you know the results via mychart/phone.   [facility] is open 24/7 and is a walk-in urgent care  [facility] [address] [place], [place] [address] [phone]  Let's follow-up in 4-6 weeks, sooner if you have concerns.  If a referral was placed today, you will be contacted for an appointment. Please note that routine referrals can sometimes take up to 3-4 weeks to process. Please call our office if you haven't heard anything after this time frame.  Take care,  Rheba Cedar, NP

## 2023-09-11 NOTE — Progress Notes (Addendum)
 Established Patient Office Visit  Subjective   Patient ID: Alicia Moss, female    DOB: [birth year]  Age: 48 y.o. MRN: [id number]  Chief Complaint  Patient presents with   Depression    Follow up, vitamin D  low, Rx refills    HPI Discussed the use of AI scribe software for clinical note transcription with the patient, who gave verbal consent to proceed.  History of Present Illness   Alicia Moss is a 48 year old female with depression and hypertension who presents with worsening depression and follow-up after back surgery.  Eight weeks post lumbar fusion surgery, she reports significant improvement in sciatic pain and reduced back pain severity. She adheres to restrictions on lifting, bending, twisting, and prolonged sitting or standing, while maintaining activity through walking and crafting.  She experiences severe depression, exacerbated by menopause, with frequent crying, feelings of failure, and lack of motivation. She sleeps excessively to avoid emotional distress. She denies suicidal ideation. She is on the maximum dose of venlafaxine . She is in therapy but states it will take time to help.  Significant stress arises from family issues, particularly her daughter's estrangement and related legal matters, which she finds distressing. She has not seen her daughter or granddaughter since last August.  Her blood pressure is well-controlled with lisinopril . She still feels like her vitamin D  is low with joint and muscle pain.        09/11/2023    3:51 PM 02/24/2023    3:59 PM 08/09/2022   11:03 AM 04/29/2022    9:02 AM 12/15/2021    9:31 AM  Depression screen PHQ 2/9  Decreased Interest 3 1 1 2 2   Down, Depressed, Hopeless 3 0 0 1 0  PHQ - 2 Score 6 1 1 3 2   Altered sleeping 3 2 2 3 2   Tired, decreased energy 3 2 1 3 2   Change in appetite 3 0 1 3 1   Feeling bad or failure about yourself  3 1 0 0 0  Trouble concentrating 3 1 2 3  0  Moving slowly or fidgety/restless 0  0 0 0 0  Suicidal thoughts 0 0 0 0 0  PHQ-9 Score 21 7 7 15 7   Difficult doing work/chores    Somewhat difficult       09/11/2023    3:51 PM 02/24/2023    4:00 PM 08/09/2022   11:04 AM 04/29/2022    9:02 AM  GAD 7 : Generalized Anxiety Score  Nervous, Anxious, on Edge 2 0 1 0  Control/stop worrying 3 1 0 0  Worry too much - different things 3 1 0 2  Trouble relaxing 2 0 3 3  Restless 0 0 1 2  Easily annoyed or irritable 3 0 2 1  Afraid - awful might happen 2 0 0 0  Total GAD 7 Score 15 2 7 8   Anxiety Difficulty Very difficult   Somewhat difficult      ROS See pertinent positives and negatives per HPI.    Objective:     BP 110/72 (BP Location: Left Arm, Patient Position: Sitting, Cuff Size: Normal)   Pulse 98   Temp (!) 97 F (36.1 C)   Ht 5' (1.524 m)   Wt 169 lb (76.7 kg)   SpO2 98%   BMI 33.01 kg/m  BP Readings from Last 3 Encounters:  09/11/23 110/72  02/24/23 110/70  10/10/22 (!) 132/90   Wt Readings from Last 3 Encounters:  09/11/23 169 lb (  76.7 kg)  02/24/23 165 lb 12.8 oz (75.2 kg)  10/10/22 163 lb (73.9 kg)      Physical Exam Vitals and nursing note reviewed.  Constitutional:      General: She is not in acute distress.    Appearance: Normal appearance.  HENT:     Head: Normocephalic.  Eyes:     Conjunctiva/sclera: Conjunctivae normal.  Cardiovascular:     Rate and Rhythm: Normal rate and regular rhythm.     Pulses: Normal pulses.     Heart sounds: Normal heart sounds.  Pulmonary:     Effort: Pulmonary effort is normal.     Breath sounds: Normal breath sounds.  Musculoskeletal:     Cervical back: Normal range of motion.  Skin:    General: Skin is warm.     Comments: Lower back incision approximated, no signs of infection  Neurological:     General: No focal deficit present.     Mental Status: She is alert and oriented to person, place, and time.  Psychiatric:        Mood and Affect: Mood normal.        Behavior: Behavior normal.         Thought Content: Thought content normal.        Judgment: Judgment normal.     The 10-year ASCVD risk score (Arnett DK, et al., 2019) is: 4.6%    Assessment & Plan:   Problem List Items Addressed This Visit       Cardiovascular and Mediastinum   Primary hypertension - Primary   Her blood pressure is well-controlled at 110/72 mmHg while continuing lisinopril . Stress and depression may affect blood pressure, but current readings are stable. Continue lisinopril  10mg  daily. Recent CMP and CBC reviewed.         Other   Anxiety and depression   She experiences severe depression with crying, feelings of failure, hypersomnia, and lack of motivation. Currently on maximum venlafaxine  with no suicidal ideation. Significant psychosocial stressors are present. Discussed adding bupropion, explaining its side effects and benefits. Start bupropion XL 150mg  once daily with venlafaxine  225mg  daily. Follow up in 4 weeks to assess response. Continue therapy sessions. Consider psychiatry referral if no improvement. Check TSH today.       Relevant Medications   buPROPion (WELLBUTRIN XL) 150 MG 24 hr tablet   Other Relevant Orders   TSH   Chronic midline low back pain without sciatica   Eight weeks post-surgery, her sciatic and back pain have improved. Continue cyclobenzaprine  for discomfort. Follow up with the surgeon in two and a half months.      Relevant Medications   buPROPion (WELLBUTRIN XL) 150 MG 24 hr tablet   Vitamin D  deficiency   She has ongoing pain suggestive of low vitamin D  levels despite supplementation and outdoor activity. Recheck vitamin D  levels.       Relevant Orders   VITAMIN D  25 Hydroxy (Vit-D Deficiency, Fractures)    Return in about 4 weeks (around 10/09/2023) for 4-6 weeks, Anxiety, Depression, may be virtual .    Odette Benjamin, NP

## 2023-09-11 NOTE — Assessment & Plan Note (Signed)
 Her blood pressure is well-controlled at 110/72 mmHg while continuing lisinopril . Stress and depression may affect blood pressure, but current readings are stable. Continue lisinopril  10mg  daily. Recent CMP and CBC reviewed.

## 2023-09-11 NOTE — Assessment & Plan Note (Signed)
 She has ongoing pain suggestive of low vitamin D  levels despite supplementation and outdoor activity. Recheck vitamin D  levels.

## 2023-09-11 NOTE — Assessment & Plan Note (Signed)
 Eight weeks post-surgery, her sciatic and back pain have improved. Continue cyclobenzaprine  for discomfort. Follow up with the surgeon in two and a half months.

## 2023-09-11 NOTE — Assessment & Plan Note (Addendum)
 She experiences severe depression with crying, feelings of failure, hypersomnia, and lack of motivation. Currently on maximum venlafaxine  with no suicidal ideation. Significant psychosocial stressors are present. Discussed adding bupropion, explaining its side effects and benefits. Start bupropion XL 150mg  once daily with venlafaxine  225mg  daily. Follow up in 4 weeks to assess response. Continue therapy sessions. Consider psychiatry referral if no improvement. Check TSH today.

## 2023-09-12 ENCOUNTER — Ambulatory Visit: Payer: Self-pay | Admitting: Nurse Practitioner

## 2023-09-12 LAB — VITAMIN D 25 HYDROXY (VIT D DEFICIENCY, FRACTURES): VITD: 75.3 ng/mL (ref 30.00–100.00)

## 2023-09-12 LAB — TSH: TSH: 1.14 u[IU]/mL (ref 0.35–5.50)

## 2023-09-15 ENCOUNTER — Encounter: Payer: Self-pay | Admitting: Nurse Practitioner

## 2023-09-19 ENCOUNTER — Encounter: Payer: Self-pay | Admitting: Nurse Practitioner

## 2023-09-20 ENCOUNTER — Ambulatory Visit: Admitting: Nurse Practitioner

## 2023-09-20 MED ORDER — VARENICLINE TARTRATE (STARTER) 0.5 MG X 11 & 1 MG X 42 PO TBPK: ORAL_TABLET | ORAL | 0 refills | Status: DC

## 2023-09-28 ENCOUNTER — Other Ambulatory Visit: Payer: Self-pay | Admitting: Nurse Practitioner

## 2023-09-29 NOTE — Telephone Encounter (Signed)
 Requesting: VENLAFAXINE  HCL ER 225 MG TAB  Last Visit: 09/11/2023 Next Visit: Visit date not found Last Refill: 08/25/2023  Please Advise

## 2023-10-07 ENCOUNTER — Other Ambulatory Visit: Payer: Self-pay | Admitting: Nurse Practitioner

## 2023-10-08 ENCOUNTER — Other Ambulatory Visit: Payer: Self-pay | Admitting: Nurse Practitioner

## 2023-10-09 MED ORDER — LISINOPRIL 10 MG PO TABS: 10.0000 mg | ORAL_TABLET | Freq: Every day | ORAL | 2 refills | Status: DC

## 2023-10-09 NOTE — Telephone Encounter (Signed)
 Requesting: Lisinopril  Last Visit: 09/11/2023 Next Visit: Visit date not found Last Refill: 10/09/2023  Please Advise

## 2023-10-09 NOTE — Telephone Encounter (Signed)
 Requesting: Lisinopril   Last Visit: 09/11/2023 Next Visit: no follow up appointment Last Refill: 07/05/2023  Please Advise

## 2023-10-09 NOTE — Telephone Encounter (Signed)
 Requesting: VIT D2 (ERGOCAL) 1.25MG (50,000U) CP  Last Visit: 09/11/2023 Next Visit: 10/08/2023 Last Refill: 07/17/2023  Please Advise

## 2023-10-09 NOTE — Addendum Note (Signed)
 Addended by: Rockford Churches Y on: 10/09/2023 12:59 PM   Modules accepted: Orders

## 2023-10-12 DIAGNOSIS — Z419 Encounter for procedure for purposes other than remedying health state, unspecified: Secondary | ICD-10-CM | POA: Diagnosis not present

## 2023-10-19 ENCOUNTER — Other Ambulatory Visit: Payer: Self-pay | Admitting: Nurse Practitioner

## 2023-10-20 ENCOUNTER — Other Ambulatory Visit: Payer: Self-pay | Admitting: Nurse Practitioner

## 2023-10-20 MED ORDER — VARENICLINE TARTRATE 1 MG PO TABS: 1.0000 mg | ORAL_TABLET | Freq: Two times a day (BID) | ORAL | 0 refills | Status: DC

## 2023-10-20 NOTE — Telephone Encounter (Signed)
 Requesting: VARENICLINE  STARTING MONTH BOX  Last Visit: 09/11/2023 Next Visit: Visit date not found Last Refill: 09/20/2023  Please Advise

## 2023-11-04 ENCOUNTER — Other Ambulatory Visit: Payer: Self-pay | Admitting: Nurse Practitioner

## 2023-11-07 NOTE — Telephone Encounter (Signed)
 Requesting: buPROPion  HCL XL 150 MG TABLET , VENLAFAXINE  HCL ER 225 MG TAB  Last Visit: 09/11/2023 Next Visit: 11/13/2023 Last Refill: 09/11/2023, 09/29/2023  Please Advise

## 2023-11-11 DIAGNOSIS — Z419 Encounter for procedure for purposes other than remedying health state, unspecified: Secondary | ICD-10-CM | POA: Diagnosis not present

## 2023-11-13 ENCOUNTER — Telehealth: Payer: Self-pay

## 2023-11-13 ENCOUNTER — Encounter: Admitting: Nurse Practitioner

## 2023-11-13 NOTE — Telephone Encounter (Signed)
 Noted, I connected and no-one was connected and I sent another link by email.

## 2023-11-13 NOTE — Telephone Encounter (Signed)
 I called patient three times to get checked in for her Mychart Video visit and her phone says that call can not be completed. I sent patient a link to check in via phone and by email.

## 2023-11-14 NOTE — Progress Notes (Signed)
 Patient did not answer phone or join mychart video visits via multiple attempts.

## 2023-11-15 ENCOUNTER — Encounter: Payer: Self-pay | Admitting: Nurse Practitioner

## 2023-12-12 DIAGNOSIS — Z419 Encounter for procedure for purposes other than remedying health state, unspecified: Secondary | ICD-10-CM | POA: Diagnosis not present

## 2024-01-04 ENCOUNTER — Other Ambulatory Visit: Payer: Self-pay | Admitting: Physician Assistant

## 2024-01-04 DIAGNOSIS — M961 Postlaminectomy syndrome, not elsewhere classified: Secondary | ICD-10-CM

## 2024-01-04 DIAGNOSIS — M48062 Spinal stenosis, lumbar region with neurogenic claudication: Secondary | ICD-10-CM

## 2024-01-12 DIAGNOSIS — Z419 Encounter for procedure for purposes other than remedying health state, unspecified: Secondary | ICD-10-CM | POA: Diagnosis not present

## 2024-01-15 ENCOUNTER — Inpatient Hospital Stay: Admission: RE | Admit: 2024-01-15 | Source: Ambulatory Visit

## 2024-01-15 ENCOUNTER — Other Ambulatory Visit

## 2024-01-17 ENCOUNTER — Other Ambulatory Visit: Payer: Self-pay | Admitting: Nurse Practitioner

## 2024-01-17 NOTE — Telephone Encounter (Addendum)
 Requesting: buPROPion  HCL XL 150 MG TABLET ,  VENLAFAXINE  HCL ER 225 MG TAB , LISINOPRIL  10 MG TABLET  Last Visit: 09/11/2023 Next Visit: Visit date not found Patient was to follow up in June 2025 Last Refill: 11/07/2023, 11/07/2023, 10/09/2023  Please Advise

## 2024-01-19 ENCOUNTER — Inpatient Hospital Stay: Admission: RE | Admit: 2024-01-19 | Source: Ambulatory Visit

## 2024-01-19 ENCOUNTER — Other Ambulatory Visit

## 2024-01-30 NOTE — Discharge Instructions (Signed)

## 2024-02-01 ENCOUNTER — Ambulatory Visit
Admission: RE | Admit: 2024-02-01 | Discharge: 2024-02-01 | Disposition: A | Discharge status: Home / Self Care | Source: Ambulatory Visit | Attending: Physician Assistant | Admitting: Physician Assistant

## 2024-02-01 ENCOUNTER — Inpatient Hospital Stay
Admission: RE | Admit: 2024-02-01 | Discharge: 2024-02-01 | Disposition: A | Source: Ambulatory Visit | Attending: Physician Assistant

## 2024-02-01 DIAGNOSIS — M961 Postlaminectomy syndrome, not elsewhere classified: Secondary | ICD-10-CM

## 2024-02-01 DIAGNOSIS — M48062 Spinal stenosis, lumbar region with neurogenic claudication: Secondary | ICD-10-CM

## 2024-02-01 MED ORDER — ONDANSETRON HCL 4 MG/2ML IJ SOLN: 4.0000 mg | Freq: Once | INTRAMUSCULAR | Status: DC | PRN

## 2024-02-01 MED ORDER — IOPAMIDOL (ISOVUE-M 200) INJECTION 41%
20.0000 mL | Freq: Once | INTRAMUSCULAR | Status: AC
Start: 2024-02-01 — End: 2024-02-01
  Administered 2024-02-01: 20 mL via INTRATHECAL

## 2024-02-01 MED ORDER — MEPERIDINE HCL 50 MG/ML IJ SOLN: 50.0000 mg | Freq: Once | INTRAMUSCULAR | Status: DC | PRN

## 2024-02-01 MED ORDER — DIAZEPAM 5 MG PO TABS
10.0000 mg | ORAL_TABLET | Freq: Once | ORAL | Status: AC
  Administered 2024-02-01: 5 mg via ORAL

## 2024-02-07 ENCOUNTER — Ambulatory Visit: Admitting: Nurse Practitioner

## 2024-02-07 ENCOUNTER — Encounter: Payer: Self-pay | Admitting: Nurse Practitioner

## 2024-02-07 ENCOUNTER — Ambulatory Visit: Payer: Self-pay | Admitting: Nurse Practitioner

## 2024-02-07 VITALS — BP 116/82 | HR 81 | Temp 97.4°F | Ht 60.0 in | Wt 165.0 lb

## 2024-02-07 DIAGNOSIS — E669 Obesity, unspecified: Secondary | ICD-10-CM

## 2024-02-07 DIAGNOSIS — E559 Vitamin D deficiency, unspecified: Secondary | ICD-10-CM

## 2024-02-07 DIAGNOSIS — Z23 Encounter for immunization: Secondary | ICD-10-CM | POA: Diagnosis not present

## 2024-02-07 DIAGNOSIS — F32A Depression, unspecified: Secondary | ICD-10-CM

## 2024-02-07 DIAGNOSIS — Z720 Tobacco use: Secondary | ICD-10-CM

## 2024-02-07 DIAGNOSIS — Z6832 Body mass index (BMI) 32.0-32.9, adult: Secondary | ICD-10-CM

## 2024-02-07 DIAGNOSIS — M545 Low back pain, unspecified: Secondary | ICD-10-CM

## 2024-02-07 DIAGNOSIS — G8929 Other chronic pain: Secondary | ICD-10-CM

## 2024-02-07 DIAGNOSIS — I10 Essential (primary) hypertension: Secondary | ICD-10-CM | POA: Diagnosis not present

## 2024-02-07 DIAGNOSIS — E782 Mixed hyperlipidemia: Secondary | ICD-10-CM | POA: Diagnosis not present

## 2024-02-07 DIAGNOSIS — R079 Chest pain, unspecified: Secondary | ICD-10-CM

## 2024-02-07 DIAGNOSIS — F419 Anxiety disorder, unspecified: Secondary | ICD-10-CM

## 2024-02-07 LAB — CBC WITH DIFFERENTIAL/PLATELET
Basophils Absolute: 0 K/uL (ref 0.0–0.1)
Basophils Relative: 0.4 % (ref 0.0–3.0)
Eosinophils Absolute: 0.1 K/uL (ref 0.0–0.7)
Eosinophils Relative: 0.9 % (ref 0.0–5.0)
HCT: 47.8 % — ABNORMAL HIGH (ref 36.0–46.0)
Hemoglobin: 15.7 g/dL — ABNORMAL HIGH (ref 12.0–15.0)
Lymphocytes Relative: 23.4 % (ref 12.0–46.0)
Lymphs Abs: 2.1 K/uL (ref 0.7–4.0)
MCHC: 32.9 g/dL (ref 30.0–36.0)
MCV: 91.9 fl (ref 78.0–100.0)
Monocytes Absolute: 0.6 K/uL (ref 0.1–1.0)
Monocytes Relative: 6.5 % (ref 3.0–12.0)
Neutro Abs: 6.1 K/uL (ref 1.4–7.7)
Neutrophils Relative %: 68.8 % (ref 43.0–77.0)
Platelets: 263 K/uL (ref 150.0–400.0)
RBC: 5.2 Mil/uL — ABNORMAL HIGH (ref 3.87–5.11)
RDW: 14.2 % (ref 11.5–15.5)
WBC: 8.9 K/uL (ref 4.0–10.5)

## 2024-02-07 LAB — COMPREHENSIVE METABOLIC PANEL WITH GFR
ALT: 31 U/L (ref 0–35)
AST: 20 U/L (ref 0–37)
Albumin: 4.7 g/dL (ref 3.5–5.2)
Alkaline Phosphatase: 69 U/L (ref 39–117)
BUN: 12 mg/dL (ref 6–23)
CO2: 30 meq/L (ref 19–32)
Calcium: 9.8 mg/dL (ref 8.4–10.5)
Chloride: 104 meq/L (ref 96–112)
Creatinine, Ser: 0.84 mg/dL (ref 0.40–1.20)
GFR: 81.25 mL/min (ref >60.00)
Glucose, Bld: 92 mg/dL (ref 70–99)
Potassium: 4.5 meq/L (ref 3.5–5.1)
Sodium: 142 meq/L (ref 135–145)
Total Bilirubin: 0.4 mg/dL (ref 0.2–1.2)
Total Protein: 7.2 g/dL (ref 6.0–8.3)

## 2024-02-07 LAB — LIPID PANEL
Cholesterol: 224 mg/dL — ABNORMAL HIGH (ref 0–200)
HDL: 51.2 mg/dL (ref >39.00)
LDL Cholesterol: 132 mg/dL — ABNORMAL HIGH (ref 0–99)
NonHDL: 172.6
Total CHOL/HDL Ratio: 4
Triglycerides: 205 mg/dL — ABNORMAL HIGH (ref 0.0–149.0)
VLDL: 41 mg/dL — ABNORMAL HIGH (ref 0.0–40.0)

## 2024-02-07 LAB — VITAMIN D 25 HYDROXY (VIT D DEFICIENCY, FRACTURES): VITD: 37.2 ng/mL (ref 30.00–100.00)

## 2024-02-07 LAB — TSH: TSH: 1.15 u[IU]/mL (ref 0.35–5.50)

## 2024-02-07 NOTE — Assessment & Plan Note (Signed)
 Chronic, stable. Continue effexor  225mg  daily and wellubtrin 150 mg daily.

## 2024-02-07 NOTE — Assessment & Plan Note (Signed)
 Chronic, stable. Continue lisinopril  10mg  daily. Check CMP, CBC today.

## 2024-02-07 NOTE — Assessment & Plan Note (Signed)
 She had an episode of chest pain 2 weeks ago that lasted 4 hours. She has not had any episodes since. EKG showed normal sinus rhythm with a heart rate of 79, similar to prior EKG. Check CMP, CBC, TSH today.

## 2024-02-07 NOTE — Assessment & Plan Note (Signed)
 BMI 32.2. She has lost 5 pounds recently and is exercising regularly. Discussed that hormones are not recommended just to help with weight loss since she is not having any other menopausal symptoms.

## 2024-02-07 NOTE — Assessment & Plan Note (Signed)
Chronic, stable.  Check CMP, CBC, lipid panel today. 

## 2024-02-07 NOTE — Assessment & Plan Note (Signed)
Check vitamin D and treat based on results.  ?

## 2024-02-07 NOTE — Patient Instructions (Signed)
It was great to see you!  We are checking your labs today and will let you know the results via mychart/phone.   Keep taking your medications as prescribed.  Let's follow-up in 6 months, sooner if you have concerns.  If a referral was placed today, you will be contacted for an appointment. Please note that routine referrals can sometimes take up to 3-4 weeks to process. Please call our office if you haven't heard anything after this time frame.  Take care,  Vance Peper, NP

## 2024-02-07 NOTE — Assessment & Plan Note (Signed)
 Chronic, ongoing. Continue regular exercise and collaboration and recommendations from neurosurgery.

## 2024-02-07 NOTE — Progress Notes (Signed)
 Established Patient Office Visit  Subjective   Patient ID: Alicia Moss, female    DOB: 1975-10-22  Age: 48 y.o. MRN: [id number]  Chief Complaint  Patient presents with   Medication Management    Rx refills, concerns with hormone levels, Flu Vaccine    HPI Discussed the use of AI scribe software for clinical note transcription with the patient, who gave verbal consent to proceed.  History of Present Illness   Alicia Moss is a 48 year old female who presents with chest pain.  She experienced chest pain on her [age] birthday 2 weeks ago, lasting four to five hours, with left arm discomfort. The pain resolved spontaneously and has not recurred. There was no associated shortness of breath or dizziness.  She is undergoing significant life changes, including a divorce and her son's recent marriage, which have been emotionally challenging. She smokes a pack a day and is using Wellbutrin  and Chantix  for smoking cessation, though she has resumed smoking. She is considering discontinuing Chantix  due to perceived ineffectiveness.  She experiences menopausal symptoms such as occasional night sweats and frequent hot flashes. Her mood is stable after recent adjustments. She has a history of a bone spur and bulging disc, with increased physical activity at the gym helping alleviate pain. She is currently not working and focusing on physical activity.       ROS See pertinent positives and negatives per HPI.    Objective:     BP 116/82 (BP Location: Left Arm, Patient Position: Sitting, Cuff Size: Normal)   Pulse 81   Temp (!) 97.4 F (36.3 C)   Ht 5' (1.524 m)   Wt 165 lb (74.8 kg)   SpO2 98%   BMI 32.22 kg/m  BP Readings from Last 3 Encounters:  02/07/24 116/82  02/01/24 130/73  09/11/23 110/72   Wt Readings from Last 3 Encounters:  02/07/24 165 lb (74.8 kg)  09/11/23 169 lb (76.7 kg)  02/24/23 165 lb 12.8 oz (75.2 kg)      Physical Exam Vitals and nursing note  reviewed.  Constitutional:      General: She is not in acute distress.    Appearance: Normal appearance.  HENT:     Head: Normocephalic.  Eyes:     Conjunctiva/sclera: Conjunctivae normal.  Cardiovascular:     Rate and Rhythm: Normal rate and regular rhythm.     Pulses: Normal pulses.     Heart sounds: Normal heart sounds.  Pulmonary:     Effort: Pulmonary effort is normal.     Breath sounds: Normal breath sounds.  Musculoskeletal:     Cervical back: Normal range of motion.  Skin:    General: Skin is warm.  Neurological:     General: No focal deficit present.     Mental Status: She is alert and oriented to person, place, and time.  Psychiatric:        Mood and Affect: Mood normal.        Behavior: Behavior normal.        Thought Content: Thought content normal.        Judgment: Judgment normal.     The 10-year ASCVD risk score (Arnett DK, et al., 2019) is: 5%    Assessment & Plan:   Problem List Items Addressed This Visit       Cardiovascular and Mediastinum   Primary hypertension   Chronic, stable. Continue lisinopril  10mg  daily. Check CMP, CBC today.  Other   Tobacco use   She is back to smoking 1ppd. Discussed stopping chantix  at this point and can resume in the future if interested in trying smoking cessation again.       Anxiety and depression   Chronic, stable. Continue effexor  225mg  daily and wellubtrin 150 mg daily.       Chronic midline low back pain without sciatica   Chronic, ongoing. Continue regular exercise and collaboration and recommendations from neurosurgery.       Relevant Medications   diclofenac (VOLTAREN) 75 MG EC tablet   Obesity (BMI 30-39.9)   BMI 32.2. She has lost 5 pounds recently and is exercising regularly. Discussed that hormones are not recommended just to help with weight loss since she is not having any other menopausal symptoms.       Vitamin D  deficiency   Check vitamin D  and treat based on results.        Relevant Orders   VITAMIN D  25 Hydroxy (Vit-D Deficiency, Fractures) (Completed)   Chest pain - Primary   She had an episode of chest pain 2 weeks ago that lasted 4 hours. She has not had any episodes since. EKG showed normal sinus rhythm with a heart rate of 79, similar to prior EKG. Check CMP, CBC, TSH today.       Relevant Orders   CBC with Differential/Platelet (Completed)   Comprehensive metabolic panel with GFR (Completed)   EKG 12-Lead (Completed)   Lipid panel (Completed)   TSH (Completed)   Mixed hyperlipidemia   Chronic, stable. Check CMP, CBC, lipid panel today.       Relevant Orders   CBC with Differential/Platelet (Completed)   Comprehensive metabolic panel with GFR (Completed)   Lipid panel (Completed)   Other Visit Diagnoses       Immunization due       Flu vaccine given today   Relevant Orders   Flu vaccine trivalent PF, 6mos and older(Flulaval,Afluria,Fluarix,Fluzone) (Completed)       Return in about 6 months (around 08/07/2024) for CPE.    Tinnie DELENA Harada, NP

## 2024-02-07 NOTE — Assessment & Plan Note (Signed)
 She is back to smoking 1ppd. Discussed stopping chantix  at this point and can resume in the future if interested in trying smoking cessation again.

## 2024-02-07 NOTE — Progress Notes (Signed)
 EKG interpreted by me on 02/07/24 showed normal sinus rhythm with a heart rate of 79, similar to prior EKG.

## 2024-02-21 ENCOUNTER — Other Ambulatory Visit: Payer: Self-pay | Admitting: Nurse Practitioner

## 2024-02-22 NOTE — Telephone Encounter (Signed)
 Requesting: VARENICLINE  STARTING MONTH BOX , VENLAFAXINE  HCL ER 225 MG TAB  Last Visit: 02/07/2024 Next Visit: Visit date not found Last Refill: The original prescription was discontinued on 10/20/2023 by Nedra Tinnie LABOR, NP. Renewing this prescription may not be appropriate.01/17/2024  Please Advise

## 2024-02-24 ENCOUNTER — Other Ambulatory Visit: Payer: Self-pay | Admitting: Nurse Practitioner

## 2024-02-26 NOTE — Telephone Encounter (Signed)
 Requesting: buPROPion  HCL XL 150 MG TABLET  Last Visit: 02/07/2024 Next Visit: Visit date not found Last Refill: 01/17/2024  Please Advise

## 2024-02-29 ENCOUNTER — Encounter: Payer: Self-pay | Admitting: Nurse Practitioner

## 2024-03-01 MED ORDER — VARENICLINE TARTRATE (STARTER) 0.5 MG X 11 & 1 MG X 42 PO TBPK: ORAL_TABLET | ORAL | 0 refills | Status: AC

## 2024-04-12 DIAGNOSIS — Z419 Encounter for procedure for purposes other than remedying health state, unspecified: Secondary | ICD-10-CM | POA: Diagnosis not present

## 2024-04-24 ENCOUNTER — Other Ambulatory Visit: Payer: Self-pay | Admitting: Nurse Practitioner

## 2024-04-24 NOTE — Telephone Encounter (Signed)
 Requesting: LISINOPRIL  10 MG TABLET  Last Visit: 02/07/2024 Next Visit: Visit date not found Last Refill: 01/17/2024  Please Advise

## 2024-04-30 ENCOUNTER — Encounter: Payer: Self-pay | Admitting: Nurse Practitioner

## 2024-05-29 ENCOUNTER — Telehealth: Payer: Self-pay

## 2024-05-29 ENCOUNTER — Encounter: Payer: Self-pay | Admitting: Nurse Practitioner

## 2024-05-29 NOTE — Telephone Encounter (Signed)
 Can we get a prior auth on Venlafaxine  HCl 225 MG TB24 ?

## 2024-05-30 ENCOUNTER — Other Ambulatory Visit (HOSPITAL_COMMUNITY): Payer: Self-pay

## 2024-05-30 MED ORDER — VENLAFAXINE HCL ER 75 MG PO CP24: 75.0000 mg | ORAL_CAPSULE | Freq: Every day | ORAL | 0 refills | Status: AC

## 2024-05-30 MED ORDER — VENLAFAXINE HCL ER 150 MG PO CP24: 150.0000 mg | ORAL_CAPSULE | Freq: Every day | ORAL | 0 refills | Status: AC

## 2024-05-30 NOTE — Telephone Encounter (Signed)
 Per test claim Venlafaxine  ER tablets are non formulary on plan.    Plan covers venlafaxine  tablet / ER capsules (generic for Effexor , Effexor  XR).  Please advise if change is appropriate.

## 2024-05-30 NOTE — Telephone Encounter (Signed)
 I called and spoke with pharmacist at [facility] and he ran Rx and saw that its not covered and patient can probably be prescribed 150mg  and 75mg  to equal current dose to be covered.

## 2024-05-30 NOTE — Telephone Encounter (Signed)
 I called and spoke with patient and notified her of below message.
